# Patient Record
Sex: Female | Born: 1960 | Race: White | Hispanic: No | Marital: Married | Smoking: Never smoker
Health system: Southern US, Community
[De-identification: ages and names within clinical notes are randomized; demographics above are authoritative.]

## PROBLEM LIST (undated history)

## (undated) DIAGNOSIS — R011 Cardiac murmur, unspecified: Secondary | ICD-10-CM

## (undated) DIAGNOSIS — M1990 Unspecified osteoarthritis, unspecified site: Secondary | ICD-10-CM

## (undated) DIAGNOSIS — G893 Neoplasm related pain (acute) (chronic): Secondary | ICD-10-CM

## (undated) DIAGNOSIS — Z9289 Personal history of other medical treatment: Secondary | ICD-10-CM

## (undated) DIAGNOSIS — R51 Headache: Secondary | ICD-10-CM

## (undated) DIAGNOSIS — R519 Headache, unspecified: Secondary | ICD-10-CM

## (undated) DIAGNOSIS — F329 Major depressive disorder, single episode, unspecified: Secondary | ICD-10-CM

## (undated) DIAGNOSIS — F32A Depression, unspecified: Secondary | ICD-10-CM

## (undated) DIAGNOSIS — A6920 Lyme disease, unspecified: Secondary | ICD-10-CM

## (undated) DIAGNOSIS — F410 Panic disorder [episodic paroxysmal anxiety] without agoraphobia: Secondary | ICD-10-CM

## (undated) HISTORY — PX: TUMOR REMOVAL: SHX12

## (undated) HISTORY — DX: Neoplasm related pain (acute) (chronic): G89.3

## (undated) HISTORY — PX: TUBAL LIGATION: SHX77

## (undated) HISTORY — PX: TONSILLECTOMY AND ADENOIDECTOMY: SUR1326

## (undated) HISTORY — DX: Cardiac murmur, unspecified: R01.1

---

## 1999-03-05 ENCOUNTER — Encounter: Payer: Self-pay | Admitting: Obstetrics and Gynecology

## 1999-03-05 ENCOUNTER — Ambulatory Visit (HOSPITAL_COMMUNITY): Admission: RE | Admit: 1999-03-05 | Discharge: 1999-03-05 | Payer: Self-pay | Admitting: Obstetrics and Gynecology

## 1999-04-05 ENCOUNTER — Ambulatory Visit (HOSPITAL_COMMUNITY): Admission: RE | Admit: 1999-04-05 | Discharge: 1999-04-05 | Payer: Self-pay | Admitting: Obstetrics and Gynecology

## 1999-04-05 ENCOUNTER — Encounter: Payer: Self-pay | Admitting: Obstetrics and Gynecology

## 1999-07-02 ENCOUNTER — Ambulatory Visit (HOSPITAL_COMMUNITY): Admission: RE | Admit: 1999-07-02 | Discharge: 1999-07-02 | Payer: Self-pay | Admitting: Obstetrics and Gynecology

## 1999-07-02 ENCOUNTER — Encounter: Payer: Self-pay | Admitting: Obstetrics and Gynecology

## 1999-07-27 ENCOUNTER — Inpatient Hospital Stay (HOSPITAL_COMMUNITY): Admission: AD | Admit: 1999-07-27 | Discharge: 1999-07-30 | Payer: Self-pay | Admitting: Obstetrics and Gynecology

## 1999-10-11 ENCOUNTER — Encounter: Admission: RE | Admit: 1999-10-11 | Discharge: 2000-01-09 | Payer: Self-pay | Admitting: Obstetrics and Gynecology

## 2000-01-12 ENCOUNTER — Encounter: Admission: RE | Admit: 2000-01-12 | Discharge: 2000-04-11 | Payer: Self-pay | Admitting: Obstetrics and Gynecology

## 2000-02-19 ENCOUNTER — Other Ambulatory Visit: Admission: RE | Admit: 2000-02-19 | Discharge: 2000-02-19 | Payer: Self-pay | Admitting: Obstetrics and Gynecology

## 2001-06-29 ENCOUNTER — Other Ambulatory Visit: Admission: RE | Admit: 2001-06-29 | Discharge: 2001-06-29 | Payer: Self-pay | Admitting: Obstetrics and Gynecology

## 2001-07-29 ENCOUNTER — Ambulatory Visit (HOSPITAL_COMMUNITY): Admission: RE | Admit: 2001-07-29 | Discharge: 2001-07-29 | Payer: Self-pay | Admitting: *Deleted

## 2001-07-29 ENCOUNTER — Encounter (INDEPENDENT_AMBULATORY_CARE_PROVIDER_SITE_OTHER): Payer: Self-pay | Admitting: Specialist

## 2002-08-23 ENCOUNTER — Other Ambulatory Visit: Admission: RE | Admit: 2002-08-23 | Discharge: 2002-08-23 | Payer: Self-pay | Admitting: Obstetrics and Gynecology

## 2003-10-12 ENCOUNTER — Other Ambulatory Visit: Admission: RE | Admit: 2003-10-12 | Discharge: 2003-10-12 | Payer: Self-pay | Admitting: Internal Medicine

## 2006-09-12 ENCOUNTER — Other Ambulatory Visit: Admission: RE | Admit: 2006-09-12 | Discharge: 2006-09-12 | Payer: Self-pay | Admitting: Obstetrics and Gynecology

## 2006-10-03 ENCOUNTER — Ambulatory Visit (HOSPITAL_COMMUNITY): Admission: RE | Admit: 2006-10-03 | Discharge: 2006-10-03 | Payer: Self-pay | Admitting: Obstetrics and Gynecology

## 2007-03-16 ENCOUNTER — Inpatient Hospital Stay (HOSPITAL_COMMUNITY): Admission: AD | Admit: 2007-03-16 | Discharge: 2007-03-16 | Payer: Self-pay | Admitting: Obstetrics and Gynecology

## 2007-03-20 ENCOUNTER — Encounter (INDEPENDENT_AMBULATORY_CARE_PROVIDER_SITE_OTHER): Payer: Self-pay | Admitting: Specialist

## 2007-03-20 ENCOUNTER — Inpatient Hospital Stay (HOSPITAL_COMMUNITY): Admission: RE | Admit: 2007-03-20 | Discharge: 2007-03-23 | Payer: Self-pay | Admitting: Obstetrics and Gynecology

## 2011-01-06 ENCOUNTER — Encounter: Payer: Self-pay | Admitting: Obstetrics and Gynecology

## 2011-02-04 ENCOUNTER — Emergency Department (HOSPITAL_COMMUNITY)
Admission: EM | Admit: 2011-02-04 | Discharge: 2011-02-04 | Disposition: A | Discharge status: Home / Self Care | Payer: No Typology Code available for payment source | Attending: Emergency Medicine | Admitting: Emergency Medicine

## 2011-02-04 ENCOUNTER — Emergency Department (HOSPITAL_COMMUNITY): Payer: No Typology Code available for payment source

## 2011-02-04 DIAGNOSIS — R079 Chest pain, unspecified: Secondary | ICD-10-CM | POA: Insufficient documentation

## 2011-02-04 DIAGNOSIS — S20219A Contusion of unspecified front wall of thorax, initial encounter: Secondary | ICD-10-CM | POA: Insufficient documentation

## 2011-05-03 NOTE — H&P (Signed)
NAME:  Lisa Flores, Lisa Flores NO.:  [id number]   MEDICAL RECORD NO.:  [id number]          PATIENT TYPE:  INP   LOCATION:  NA                            FACILITY:  [facility]   PHYSICIAN:  Sherron Monday, MD        DATE OF BIRTH:  09-19-1961   DATE OF ADMISSION:  03/20/2007  DATE OF DISCHARGE:                              HISTORY & PHYSICAL   She is being admitted for a repeat low transverse cesarean section as  well as bilateral tubal ligation.   HPI:  A 50 year old G4, P3-0-0-3, at 63 and 0 weeks for repeat low  transverse cesarean section as well as bilateral tubal ligation.  She  has had good fetal movement, no loss of fluid, no vaginal bleeding, no  contractions but occasional cramping.  She denies any symptoms of  preeclampsia.  Her pregnancy has been complicated by elevated blood  pressures and has had multiple PIH labs which have all come back within  normal limits.  She has been getting bi-weekly NSTs for her elevated  pressures.   PAST MEDICAL HISTORY:  Not significant.   PAST SURGICAL HISTORY:  1. Significant for 3 cesarean sections.  2. Also significant for lipoma removed from her back.   PAST OBGYN HISTORY:  1. G1 was a term cesarean section for failure to progress as well as      fetal stress.  2. G2 was a repeat.  3. G3 was a repeat.  4. G4 is the present pregnancy.  Patient opts for repeat low      transverse cesarean section at this time as well as bilateral tubal      ligation.  5. She has no abnormal Pap smears.  6. No history of any sexually transmitted diseases.   MEDICATIONS:  Prenatal vitamins.   ALLERGIES:  NO KNOWN DRUG ALLERGIES.   SOCIAL HISTORY:  Denies alcohol, tobacco or drug use.  She is married.   FAMILY HISTORY:  Significant for:  1. Hypertension in maternal grandmother.  2. Diabetes in her father.  3. Brain cancer in maternal grandmother.   Is not significant for coronary artery disease.  PNL: hgb 12.2, plts 256K, A +, Ab Scr  neg, UA - gbbs +, gc neg, chl neg,  rpr nr, rubella immune, hepbsag neg, hiv declined,  cf neg, glucola 123,   ultrasound: 1st trimester nl nt, nl afi, welldated  20 weeks good growth, nl anat, ant plac, female   PHYSICAL EXAM:  She is 4 feet 8 inches tall, weighs 229 pounds, blood  pressure was 144/80.  GENERAL:  No apparent distress.  CARDIOVASCULAR:  Regular rate and rhythm.  LUNGS:  Clear to auscultation bilaterally.  ABDOMEN:  Obese, soft and fundus is nontender.  EXTREMITIES:  Symmetric and nontender.  VAGINAL:  Closed, 20 and high, and her cervix was soft.  FETAL HEART TONES:  In the 140's.   ASSESSMENT AND PLAN:  A 50 year old gravida 4, para 3-0-0-3, at 63 and 0  weeks for repeat low transverse cesarean section as well as bilateral  tubal ligation.  I discussed with  the patient risks, benefits and  alternatives of the cesarean section including bleeding, infection,  damage to surrounding organs as well as risk of a hysterectomy and risk  with adhesions.  We also discussed the risks, benefits and alternatives  of bilateral tubal ligation including small risk of failure and  increased chance of an ectopic pregnancy with failure.  Patient voiced  understanding and wished to proceed.  She will go to the hospital  tomorrow morning.  Prior to her cesarean section we will check routine  laboratories as well as pregnancy-induced hypertension laboratories and  blood pressure will be monitored closely.      Sherron Monday, MD  Electronically Signed     JB/MEDQ  D:  03/19/2007  T:  03/19/2007  Job:  [id number]

## 2011-05-03 NOTE — Op Note (Signed)
NAME:  NORISSA, BARTEE NO.:  [id number]   MEDICAL RECORD NO.:  [id number]          PATIENT TYPE:  INP   LOCATION:  NA                            [facility]  [facility]   PHYSICIAN:  Sherron Monday, MD        DATE OF BIRTH:  Jan 03, 1961   DATE OF PROCEDURE:  03/20/2007  DATE OF DISCHARGE:                               OPERATIVE REPORT   PREOPERATIVE DIAGNOSES:  1. Intrauterine pregnancy at term.  2. History of low transverse Cesarean section x3.  3. Undesired fertility.   POSTOPERATIVE DIAGNOSES:  1. Intrauterine pregnancy at term.  2. History of low transverse Cesarean section x3.  3. Undesired fertility.  4. Delivered via repeat low transverse Cesarean section with Bilateral      tubal ligation.   PROCEDURE:  1. Repeat low transverse Cesarean section.  2. Bilateral tubal ligation by [place] method.   SURGEON:  Sherron Monday, M.D.   ASSISTANT:  Huel Cote, M.D.   ANESTHESIA:  Spinal.   COMPLICATIONS:  None.   PATHOLOGY:  Bilateral tubal segments.   ESTIMATED BLOOD LOSS:  400 mL.   INTRAVENOUS FLUIDS:  2500 mL.   URINE OUTPUT:  125 mL of clear urine at the end of the procedure.   FINDINGS:  Viable female infant at 7:48 with Apgars of 8 at one minute and  9 at five minutes and a weight of 7 pounds 12 ounces. Normal uterus,  tubes and ovaries.   DISPOSITION:  Stable to PACU at the end of the procedure.   PROCEDURE:  After informed consent was reviewed with the patient  including the risks, benefits and alternatives of the surgical procedure  as well as failure rate of a tubal ligation of less than 1:200, she was  transported to the OR where spinal was placed and found to be adequate.  She was then prepped and draped in a normal sterile fashion with a  leftward tilt. A Pfannenstiel skin incision was made at the level of the  previous Pfannenstiel incision approximately 2 fingerbreadths above the  pubic symphysis and carried through the underlying layer  of fascia  sharply. The fascia was incised in the midline. The incision was  extended bilaterally with Mayo scissors. Superior aspect of the fascial  incision was grasped with Kocher clamps, elevated and the rectus muscles  were dissected off bluntly and with Bovie cautery. Attention was then  turned to the inferior portion which in a similar fashion was grasped  with Kocher clamps, tented up, and the rectus muscles were dissected off  bluntly and with Bovie cautery. Inadvertently, the peritoneal cavity had  been entered during the dissection of the rectus muscles. The peritoneal  incision was extended with good visualization of the bladder. An Alexis  retractor was placed without complication. The placement was verified.  The uterus was inspected, and bladder flap was created at the  vesicouterine peritoneum. The peritoneum was grasped with smooth  pickups, and using Metzenbaum scissors, bladder flap was created. Using  a scalpel, an uterine incision was made, and the infant delivered  without difficulty from a  vertex presentation. The placenta was then  expressed. The uterus was cleared of all clots and debris. The uterine  incision was closed with 2 layers of 0 Monocryl, the first was a running  locked and the second is an imbricating suture. Attention was then  turned to performing the tubal ligation. The left tube was then  identified out to the fimbriated end. The ovaries were also  investigated. The tube was elevated using a Babcock. A window was made  in the mesosalpinx using Bovie cautery, and using 2 sutures of plain  gut, the tube was ligated and excised. It was found to be hemostatic and  handed off to be sent to pathology. Hemostasis was assured, and  attention was turned to the right tube which in a similar fashion was  identified, followed out to the fimbriated end and elevated Babcock.  Using a Bovie, a window was made in the mesosalpinx. The tube was doubly  ligated using  0 plain gut and the intervening portion was sent to  pathology. Hemostasis was again assured. The pelvis was cleared of all  clot and debris, and the subfascial areas were inspected. The fascia was  closed with 0 Vicryl in a running fashion. The subcuticular adipose  layer was made hemostatic with Bovie cautery. A single running suture of  plain gut was placed to close the dead space, and the skin was closed  with staples. Sponge, lap and needle counts were correct x2 at the end  of the procedure per the operating room staff.      Sherron Monday, MD  Electronically Signed     JB/MEDQ  D:  03/20/2007  T:  03/20/2007  Job:  [id number]

## 2011-05-03 NOTE — Discharge Summary (Signed)
Lisa Flores, Lisa Flores NO.:  [id number]   MEDICAL RECORD NO.:  [id number]          PATIENT TYPE:  INP   LOCATION:  9120                          [facility]ACILITY:  [facility]   PHYSICIAN:  Sherron Monday, MD        DATE OF BIRTH:  07-16-1961   DATE OF ADMISSION:  03/20/2007  DATE OF DISCHARGE:  03/23/2007                               DISCHARGE SUMMARY   ADMISSION DIAGNOSES:  1. Intrauterine pregnancy at term.  2. History of low transverse cesarean section x3.  3. Undesired fertility.   DISCHARGE DIAGNOSES:  1. Intrauterine pregnancy at term.  2. History of low transverse cesarean section x3.  3. Undesired fertility.  4. Status post repeat low transverse cesarean section, bilateral tubal      ligation.   HISTORY OF PRESENT ILLNESS:  50 year old G4, P3-0-0-3 at 28 and 0 weeks  who was admitted on March 20, 2007 for repeat low transverse cesarean  section as well as a bilateral tubal ligation.  She had no complaints at  this time.  Her pregnancy had been complicated by elevated blood  pressures, and she had been evaluated for pregnancy-induced hypertension  on several occasions.   PAST MEDICAL HISTORY:  Negative.   PAST SURGICAL HISTORY:  Significant for three cesarean sections as well  as a lipoma removal from her back.   PAST OB/GYN HISTORY:  G1 was a term cesarean section for failure to  progress in 1997.  G2 was a repeat low transverse cesarean section, 7  pounds 1 ounce.  G3 was a repeat cesarean section in 2004 for at 7 pound  12 ounce baby.  G4 is the present pregnancy.  The patient desires a  repeat as well as a bilateral tubal ligation.  No history of any  abnormal Pap smears.  No history of any sexually transmitted diseases.   MEDICATIONS:  Prenatal vitamins.   ALLERGIES:  NO KNOWN DRUG ALLERGIES.   SOCIAL HISTORY:  Denies alcohol, tobacco, or drug use and is married.   FAMILY HISTORY:  Hypertension in maternal grandmother, diabetes in  father, brain cancer  in maternal grandmother.   PHYSICAL EXAMINATION:  VITAL SIGNS:  On admission, she was afebrile and  vital signs stable with a benign exam.   HOSPITAL COURSE:  The patient was admitted and underwent repeat cesarean  section without complication, delivering a viable female infant at 7:48 on  March 20, 2007, with Apgars of 8 at 1 minute and 9 at 5 minutes and a  weight of 7 pounds 12 ounces.  Normal uterus, tubes, and ovaries.  Estimated blood loss was approximately 400 mL.   Her postpartum course was relatively uncomplicated, with some decreased  urine output after the cesarean section which was relieved with a bolus.  She was discharged to home on postoperative day #3 at her request.  At  this time she had normal lochia, her pain was well-controlled, she was  tolerating a diet, ambulating well, and voiding without difficulty.  She  was discharged to home with routine discharge instructions and numbers  to call with any questions or  problems as well as prescriptions for  Motrin, Vicodin, and prenatal vitamins.   DISCHARGE DATA:  A positive, rubella immune.  Her hemoglobin decreased  from 12.6 to 10.8.  She will using a bilateral tubal ligation for  contraception.  She plans to breast feed.      Sherron Monday, MD  Electronically Signed     JB/MEDQ  D:  03/23/2007  T:  03/23/2007  Job:  [id number]

## 2011-05-03 NOTE — Op Note (Signed)
[facility]  Patient:    Lisa Flores, Lisa Flores                       MRN: [id number] Proc. Date: 07/29/01 Attending:  Vikki Ports, M.D.                           Operative Report  PREOPERATIVE DIAGNOSIS:  Right upper back subcutaneous mass.  POSTOPERATIVE DIAGNOSIS:  Right upper back subcutaneous mass, lipoma.  OPERATION:  Excision of right upper back subcutaneous mass.  ANESTHESIA:  Local MAC.  SURGEON:  Vikki Ports, M.D.  DESCRIPTION OF PROCEDURE:  The patient was taken to the operating room and placed in the supine position.  After adequate MAC anesthesia was induced, the patient was placed in the left lateral decubitus position.  Using lidocaine with bicarbonate, the skin overlying and the surrounding the mass of the upper back was anesthetized.  A vertical incision was made over the mass and dissected down through the subcutaneous fat.  A large lipoma was encountered. It was fairly densely adherent to surrounding fascia and this was incised using Bovie electrocautery and the lipoma was removed en bloc.  Adequate hemostasis was ensured and the skin was closed with staples.  A sterile dressing was applied.  The patient tolerated the procedure well and went to PACU in good condition. DD:  07/29/01 TD:  07/29/01 Job: [id number]

## 2011-06-28 ENCOUNTER — Ambulatory Visit (INDEPENDENT_AMBULATORY_CARE_PROVIDER_SITE_OTHER): Payer: Self-pay | Admitting: General Surgery

## 2011-06-28 ENCOUNTER — Encounter (INDEPENDENT_AMBULATORY_CARE_PROVIDER_SITE_OTHER): Payer: Self-pay | Admitting: General Surgery

## 2011-06-28 VITALS — BP 166/104 | HR 88 | Temp 96.6°F | Ht <= 58 in | Wt 225.8 lb

## 2011-06-28 DIAGNOSIS — D1779 Benign lipomatous neoplasm of other sites: Secondary | ICD-10-CM

## 2011-06-28 DIAGNOSIS — D171 Benign lipomatous neoplasm of skin and subcutaneous tissue of trunk: Secondary | ICD-10-CM

## 2011-06-28 NOTE — Progress Notes (Signed)
Subjective:     Patient ID: Lisa Flores, female   DOB: 1961-05-30, 50 y.o.   MRN: [id number]    BP 166/104  Pulse 88  Temp 96.6 F (35.9 C)  Ht 4\' 8"  (1.422 m)  Wt 225 lb 12.8 oz (102.422 kg)  BMI 50.62 kg/m2    HPI The patient is a 50 year old white female who had a lipoma removed from her upper back about 6 years ago. Shortly afterward the mass recurred. It is steadily been getting larger since then. She has had some tenderness associated with it. She's had no drainage. No fevers or chills. No chest pain or shortness of breath. Review of Systems  Constitutional: Negative.   HENT: Negative.   Eyes: Negative.   Respiratory: Negative.   Cardiovascular: Negative.   Gastrointestinal: Negative.   Genitourinary: Negative.   Musculoskeletal: Negative.   Skin: Negative.   Neurological: Negative.   Hematological: Negative.   Psychiatric/Behavioral: Negative.        Objective:   Physical Exam  Constitutional: She is oriented to person, place, and time. She appears well-developed and well-nourished.       obese  HENT:  Head: Normocephalic and atraumatic.  Eyes: Conjunctivae and EOM are normal. Pupils are equal, round, and reactive to light.  Neck: Normal range of motion. Neck supple.  Cardiovascular: Normal rate, regular rhythm, normal heart sounds and intact distal pulses.   Pulmonary/Chest: Effort normal and breath sounds normal.       Large fatty mass on upper back  Abdominal: Soft. Bowel sounds are normal.  Musculoskeletal: Normal range of motion.  Neurological: She is alert and oriented to person, place, and time. She has normal reflexes.  Skin: Skin is warm and dry.  Psychiatric: She has a normal mood and affect. Her behavior is normal.   Past Medical History  Diagnosis Date  . 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 Cesarean section   . Tonsillectomy and adenoidectomy   . Tumor removal     on back   Current outpatient  prescriptions:etodolac (LODINE) 400 MG tablet, Take 400 mg by mouth 2 (two) times daily.  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 He doesn't has recurred I would like to evaluate with ultrasound to make sure there is nothing unusual about it. If it looks straightforward and I think we could plan to remove it a second time. I have discussed with her in detail the risks and benefits the operation remove this area as well as some of the technical aspects and she understands and wishes to proceed. She understands that it may recur and that she may develop a seroma at the site.    Plan:     Ultrasound and an excision of the area.

## 2011-06-28 NOTE — Patient Instructions (Signed)
Will look at it with ultrasound. If ok then schedule surgical excision.

## 2011-07-01 ENCOUNTER — Ambulatory Visit
Admission: RE | Admit: 2011-07-01 | Discharge: 2011-07-01 | Disposition: A | Discharge status: Home / Self Care | Payer: No Typology Code available for payment source | Source: Ambulatory Visit | Attending: General Surgery | Admitting: General Surgery

## 2011-07-01 DIAGNOSIS — D171 Benign lipomatous neoplasm of skin and subcutaneous tissue of trunk: Secondary | ICD-10-CM

## 2011-07-19 DIAGNOSIS — D1739 Benign lipomatous neoplasm of skin and subcutaneous tissue of other sites: Secondary | ICD-10-CM

## 2011-07-24 ENCOUNTER — Encounter (INDEPENDENT_AMBULATORY_CARE_PROVIDER_SITE_OTHER): Payer: Self-pay | Admitting: General Surgery

## 2011-07-25 ENCOUNTER — Ambulatory Visit (INDEPENDENT_AMBULATORY_CARE_PROVIDER_SITE_OTHER): Payer: Self-pay | Admitting: General Surgery

## 2011-07-25 ENCOUNTER — Encounter (INDEPENDENT_AMBULATORY_CARE_PROVIDER_SITE_OTHER): Payer: Self-pay | Admitting: General Surgery

## 2011-07-25 DIAGNOSIS — D171 Benign lipomatous neoplasm of skin and subcutaneous tissue of trunk: Secondary | ICD-10-CM

## 2011-07-25 DIAGNOSIS — D1779 Benign lipomatous neoplasm of other sites: Secondary | ICD-10-CM

## 2011-07-25 NOTE — Patient Instructions (Signed)
Continue to empty drain twice a day and record output

## 2011-07-29 ENCOUNTER — Encounter (INDEPENDENT_AMBULATORY_CARE_PROVIDER_SITE_OTHER): Payer: Self-pay | Admitting: General Surgery

## 2011-07-29 DIAGNOSIS — D171 Benign lipomatous neoplasm of skin and subcutaneous tissue of trunk: Secondary | ICD-10-CM | POA: Insufficient documentation

## 2011-07-29 NOTE — Progress Notes (Signed)
Subjective:     Patient ID: Lisa Flores, female   DOB: 1961/09/09, 50 y.o.   MRN: [id number]  HPI The patient is a 50 year old white female who is now 6 days out from excision of a large lipoma from her back. We left a drain in place to try to prevent a seroma. She has tolerated this well. She has no complaints today. Her drain is draining about 40-50 cc of serous fluid a day.  Review of Systems     Objective:   Physical Exam On exam the incision on her back is healing nicely. There is no evidence of seroma or infection. The drain is in place.   Assessment:     6 days out from excision of a large lipoma from the back with drain placement.    Plan:     I will plan to leave the drain in until next week at which time we will try to remove it. She agrees to call me if she has any problems in the meantime.

## 2011-08-01 ENCOUNTER — Ambulatory Visit (INDEPENDENT_AMBULATORY_CARE_PROVIDER_SITE_OTHER): Payer: Self-pay | Admitting: General Surgery

## 2011-08-01 ENCOUNTER — Encounter (INDEPENDENT_AMBULATORY_CARE_PROVIDER_SITE_OTHER): Payer: Self-pay | Admitting: General Surgery

## 2011-08-01 DIAGNOSIS — D1779 Benign lipomatous neoplasm of other sites: Secondary | ICD-10-CM

## 2011-08-01 DIAGNOSIS — D171 Benign lipomatous neoplasm of skin and subcutaneous tissue of trunk: Secondary | ICD-10-CM

## 2011-08-01 NOTE — Patient Instructions (Signed)
May shower and return to normal activities

## 2011-08-05 ENCOUNTER — Encounter (INDEPENDENT_AMBULATORY_CARE_PROVIDER_SITE_OTHER): Payer: Self-pay | Admitting: General Surgery

## 2011-08-05 NOTE — Progress Notes (Signed)
Subjective:     Patient ID: Lisa Flores, female   DOB: 1961-03-11, 50 y.o.   MRN: [id number]  HPI 2 weeks postop from removal of a large lipoma from her back. Drain is putting out minimal fluid. No pain. No complaints  Review of Systems     Objective:   Physical Exam Incision is healing well. Drain removed without difficulty    Assessment:     Postop lipoma removal    Plan:     F/U in 2 weeks to check for fluid

## 2011-08-15 ENCOUNTER — Ambulatory Visit (INDEPENDENT_AMBULATORY_CARE_PROVIDER_SITE_OTHER): Payer: Self-pay | Admitting: General Surgery

## 2011-08-15 DIAGNOSIS — D1779 Benign lipomatous neoplasm of other sites: Secondary | ICD-10-CM

## 2011-08-15 DIAGNOSIS — D171 Benign lipomatous neoplasm of skin and subcutaneous tissue of trunk: Secondary | ICD-10-CM

## 2011-08-15 NOTE — Patient Instructions (Signed)
May return to normal activities F/U in 3-4 weeks

## 2011-08-20 NOTE — Progress Notes (Signed)
Subjective:     Patient ID: Lisa Flores, female   DOB: 1961/04/10, 49 y.o.   MRN: [id number]  HPI The patient is a 51 year old white female who is now about 3 weeks out from an excision of a large lipoma from her back. A drain was left in place and the drain was removed at her last visit. She complains only of some minor soreness at the operative site.  Review of Systems     Objective:   Physical Exam On exam her back incision is healing nicely. There is no sign of infection. She has a very small amount of accumulated fluid underneath the skin.    Assessment:     Postop from excision of a large lipoma from the back    Plan:     At this point we will not try to aspirate the fluid. We will follow her up closely to see if the fluid pocket enlarges. She agrees to call if she has any problems. Otherwise we'll see her back in about 2 weeks.

## 2011-09-19 ENCOUNTER — Encounter (INDEPENDENT_AMBULATORY_CARE_PROVIDER_SITE_OTHER): Payer: Self-pay | Admitting: General Surgery

## 2011-09-27 ENCOUNTER — Encounter (INDEPENDENT_AMBULATORY_CARE_PROVIDER_SITE_OTHER): Payer: Self-pay | Admitting: General Surgery

## 2012-02-27 ENCOUNTER — Ambulatory Visit: Payer: Self-pay | Admitting: Family Medicine

## 2012-02-27 VITALS — BP 149/74 | HR 84 | Temp 98.6°F | Resp 16 | Ht <= 58 in | Wt 225.6 lb

## 2012-02-27 DIAGNOSIS — Z131 Encounter for screening for diabetes mellitus: Secondary | ICD-10-CM

## 2012-02-27 DIAGNOSIS — R21 Rash and other nonspecific skin eruption: Secondary | ICD-10-CM

## 2012-02-27 DIAGNOSIS — L259 Unspecified contact dermatitis, unspecified cause: Secondary | ICD-10-CM

## 2012-02-27 DIAGNOSIS — L309 Dermatitis, unspecified: Secondary | ICD-10-CM

## 2012-02-27 MED ORDER — PREDNISONE 20 MG PO TABS: ORAL_TABLET | ORAL | Status: AC

## 2012-02-27 MED ORDER — TRIAMCINOLONE ACETONIDE 0.1 % EX CREA: TOPICAL_CREAM | Freq: Two times a day (BID) | CUTANEOUS | Status: DC

## 2012-02-27 NOTE — Progress Notes (Signed)
  Subjective:    Patient ID: Lisa Flores, female    DOB: [birth year], 51 y.o.   MRN: [id number]  HPI 51 yo female with rash. 1 month.  Dry, itchy, around waist - abdomen and back.  Tried sensitive skin detergents.  No helps.  Worsening.  Feels like hot pins, also itchy.  Now also on arms.  Derm can't get her in until May.  Has been putting on hydrocortisone and aloe vera/lidocaine gel (after sun gel).    Review of Systems Negative except as per HPI     Objective:   Physical Exam  Constitutional: She appears well-developed.  Pulmonary/Chest: Effort normal.  Neurological: She is alert.   Scattered, erythematous plaques with dry, flakes/scale and evidence of excoriation.  Worst is in axillae edges, abdomen.  Also on chest, back, upper arms.    Results for orders placed in visit on 02/27/12  GLUCOSE, POCT (MANUAL RESULT ENTRY)      Component Value Range   POC Glucose 79          Assessment & Plan:  Appears eczematous.  Pred taper and triamcinolone cream

## 2012-03-03 ENCOUNTER — Telehealth: Payer: Self-pay

## 2012-03-03 NOTE — Telephone Encounter (Signed)
LMOM to RTC. 

## 2012-03-03 NOTE — Telephone Encounter (Signed)
Pt states was seen last week regarding a rash and has been taking the medication prescribed, but is not better and wants to know what she should do at this point, please call pt at [phone]

## 2012-03-03 NOTE — Telephone Encounter (Signed)
Please advise patient to RTC. 

## 2012-03-03 NOTE — Telephone Encounter (Signed)
Please advise 

## 2012-03-04 ENCOUNTER — Other Ambulatory Visit: Payer: Self-pay | Admitting: Family Medicine

## 2012-05-26 ENCOUNTER — Other Ambulatory Visit: Payer: Self-pay | Admitting: Physician Assistant

## 2012-05-26 MED ORDER — TRIAMCINOLONE ACETONIDE 0.1 % EX CREA: TOPICAL_CREAM | Freq: Two times a day (BID) | CUTANEOUS | Status: AC

## 2013-03-10 ENCOUNTER — Ambulatory Visit: Payer: Self-pay | Admitting: Family Medicine

## 2013-03-10 ENCOUNTER — Ambulatory Visit: Payer: Self-pay

## 2013-03-10 VITALS — BP 148/76 | HR 93 | Temp 98.4°F | Resp 16 | Ht <= 58 in | Wt 230.8 lb

## 2013-03-10 DIAGNOSIS — R109 Unspecified abdominal pain: Secondary | ICD-10-CM

## 2013-03-10 DIAGNOSIS — N10 Acute tubulo-interstitial nephritis: Secondary | ICD-10-CM

## 2013-03-10 LAB — POCT URINALYSIS DIPSTICK
Bilirubin, UA: NEGATIVE
Ketones, UA: NEGATIVE
Nitrite, UA: NEGATIVE
Spec Grav, UA: 1.01
pH, UA: 7

## 2013-03-10 LAB — POCT CBC
Granulocyte percent: 91.1 %G — AB (ref 37–80)
MCHC: 31.2 g/dL — AB (ref 31.8–35.4)
MID (cbc): 0.4 (ref 0–0.9)
MPV: 7.9 fL (ref 0–99.8)
POC Granulocyte: 14.2 — AB (ref 2–6.9)
POC LYMPH PERCENT: 6.6 %L — AB (ref 10–50)
POC MID %: 2.3 %M (ref 0–12)
Platelet Count, POC: 340 10*3/uL (ref 142–424)
RDW, POC: 14.1 %

## 2013-03-10 LAB — POCT UA - MICROSCOPIC ONLY
Mucus, UA: NEGATIVE
Yeast, UA: NEGATIVE

## 2013-03-10 LAB — POCT URINE PREGNANCY: Preg Test, Ur: NEGATIVE

## 2013-03-10 MED ORDER — CIPROFLOXACIN HCL 500 MG PO TABS: 500.0000 mg | ORAL_TABLET | Freq: Two times a day (BID) | ORAL | Status: DC

## 2013-03-10 MED ORDER — HYDROCODONE-ACETAMINOPHEN 5-325 MG PO TABS: 1.0000 | ORAL_TABLET | Freq: Four times a day (QID) | ORAL | Status: DC | PRN

## 2013-03-10 MED ORDER — CEFTRIAXONE SODIUM 1 G IJ SOLR
1.0000 g | Freq: Once | INTRAMUSCULAR | Status: AC
  Administered 2013-03-10: 1 g via INTRAMUSCULAR

## 2013-03-10 MED ORDER — KETOROLAC TROMETHAMINE 60 MG/2ML IM SOLN
60.0000 mg | Freq: Once | INTRAMUSCULAR | Status: AC
  Administered 2013-03-10: 60 mg via INTRAMUSCULAR

## 2013-03-10 NOTE — Patient Instructions (Addendum)
We have given you an injection of antibiotics here in the office.  Begin the Cipro today, be sure to take the full course.  We have also given you an injection of pain medication.  You may use the Norco every 6 hours if needed for pain relief.  Continue hydrating well with water.  Come back in in 48 hours so we can recheck you and make sure you are responding to the antibiotics - come in sooner if you feel like you are worsening   Pyelonephritis, Adult Pyelonephritis is a kidney infection. In general, there are 2 main types of pyelonephritis:  Infections that come on quickly without any warning (acute pyelonephritis).  Infections that persist for a long period of time (chronic pyelonephritis). CAUSES  Two main causes of pyelonephritis are:  Bacteria traveling from the bladder to the kidney. This is a problem especially in pregnant women. The urine in the bladder can become filled with bacteria from multiple causes, including:  Inflammation of the prostate gland (prostatitis).  Sexual intercourse in females.  Bladder infection (cystitis).  Bacteria traveling from the bloodstream to the tissue part of the kidney. Problems that may increase your risk of getting a kidney infection include:  Diabetes.  Kidney stones or bladder stones.  Cancer.  Catheters placed in the bladder.  Other abnormalities of the kidney or ureter. SYMPTOMS   Abdominal pain.  Pain in the side or flank area.  Fever.  Chills.  Upset stomach.  Blood in the urine (dark urine).  Frequent urination.  Strong or persistent urge to urinate.  Burning or stinging when urinating. DIAGNOSIS  Your caregiver may diagnose your kidney infection based on your symptoms. A urine sample may also be taken. TREATMENT  In general, treatment depends on how severe the infection is.   If the infection is mild and caught early, your caregiver may treat you with oral antibiotics and send you home.  If the infection is  more severe, the bacteria may have gotten into the bloodstream. This will require intravenous (IV) antibiotics and a hospital stay. Symptoms may include:  High fever.  Severe flank pain.  Shaking chills.  Even after a hospital stay, your caregiver may require you to be on oral antibiotics for a period of time.  Other treatments may be required depending upon the cause of the infection. HOME CARE INSTRUCTIONS   Take your antibiotics as directed. Finish them even if you start to feel better.  Make an appointment to have your urine checked to make sure the infection is gone.  Drink enough fluids to keep your urine clear or pale yellow.  Take medicines for the bladder if you have urgency and frequency of urination as directed by your caregiver. SEEK IMMEDIATE MEDICAL CARE IF:   You have a fever or persistent symptoms for more than 2-3 days.  You have a fever and your symptoms suddenly get worse.  You are unable to take your antibiotics or fluids.  You develop shaking chills.  You experience extreme weakness or fainting.  There is no improvement after 2 days of treatment. MAKE SURE YOU:  Understand these instructions.  Will watch your condition.  Will get help right away if you are not doing well or get worse. Document Released: 12/02/2005 Document Revised: 06/02/2012 Document Reviewed: 05/08/2011 [facility] Patient Information 2013 [place], [place].

## 2013-03-10 NOTE — Progress Notes (Signed)
Subjective:    Patient ID: Lisa Flores, female    DOB: 1961-04-08, 52 y.o.   MRN: [id number]  HPI   [name] is a pleasant 52 yr old female here with concern for illness.  Has been having significant right sided back pain since about 7:30am this morning.  Throwing up from the pain - thinks she vomited about 4 times.  Feeling less nauseous now.  Describes the pain as sharp and burning, no radiation.  Was 10/10 this morning.  Now more like 7-8/10.  The pain waxes and wanes.  Nothing seems to make pain better or worse.  Has not taken anything, wasn't sure she could keep anything down this morning.  No history of stones.  She has never had anything like this before.  Pain is all right sided.  No dysuria, urgency, frequency, or hematuria.  No known injury to the back.  Denies abdominal pain.  Denies diarrhea or constipation.  Last BM yesterday, normal.  Usually goes every day.  Appetite down today.  Endorses subjective fever and chills but no documented fevers.     Review of Systems  Constitutional: Positive for fever (subjective), chills (subjective) and appetite change (decreased).  HENT: Negative.   Respiratory: Negative.   Cardiovascular: Negative.   Gastrointestinal: Positive for nausea and vomiting. Negative for abdominal pain and constipation.  Genitourinary: Positive for flank pain (right). Negative for dysuria, urgency, frequency and hematuria.  Musculoskeletal: Positive for back pain.  Skin: Negative.   Neurological: Negative.        Objective:   Physical Exam  Vitals reviewed. Constitutional: She is oriented to person, place, and time. She appears well-developed and well-nourished. No distress.  HENT:  Head: Normocephalic and atraumatic.  Eyes: Conjunctivae are normal. No scleral icterus.  Cardiovascular: Normal rate and regular rhythm.   Pulmonary/Chest: Effort normal and breath sounds normal. She has no wheezes. She has no rales.  Abdominal: Soft. Bowel sounds are normal.  She exhibits no distension and no mass. There is no tenderness. There is CVA tenderness (right). There is no rebound and no guarding.  Musculoskeletal:       Thoracic back: She exhibits no tenderness and no bony tenderness.       Lumbar back: She exhibits no tenderness and no bony tenderness.  Neurological: She is alert and oriented to person, place, and time.  Skin: Skin is warm and dry.  Psychiatric: She has a normal mood and affect. Her behavior is normal.     Filed Vitals:   03/10/13 1347  BP: 148/76  Pulse: 93  Temp: 98.4 F (36.9 C)  Resp: 16     Results for orders placed in visit on 03/10/13  POCT UA - MICROSCOPIC ONLY      Result Value Range   WBC, Ur, HPF, POC 2-5     RBC, urine, microscopic 0-1     Bacteria, U Microscopic trace     Mucus, UA neg     Epithelial cells, urine per micros 3-6     Crystals, Ur, HPF, POC neg     Casts, Ur, LPF, POC neg     Yeast, UA neg    POCT URINALYSIS DIPSTICK      Result Value Range   Color, UA yellow     Clarity, UA clear     Glucose, UA neg     Bilirubin, UA neg     Ketones, UA neg     Spec Grav, UA 1.010  Blood, UA small     pH, UA 7.0     Protein, UA neg     Urobilinogen, UA 0.2     Nitrite, UA neg     Leukocytes, UA small (1+)    POCT CBC      Result Value Range   WBC 15.6 (*) 4.6 - 10.2 K/uL   Lymph, poc 1.0  0.6 - 3.4   POC LYMPH PERCENT 6.6 (*) 10 - 50 %L   MID (cbc) 0.4  0 - 0.9   POC MID % 2.3  0 - 12 %M   POC Granulocyte 14.2 (*) 2 - 6.9   Granulocyte percent 91.1 (*) 37 - 80 %G   RBC 4.51  4.04 - 5.48 M/uL   Hemoglobin 12.3  12.2 - 16.2 g/dL   HCT, POC 09.8  11.9 - 47.9 %   MCV 87.3  80 - 97 fL   MCH, POC 27.3  27 - 31.2 pg   MCHC 31.2 (*) 31.8 - 35.4 g/dL   RDW, POC 14.7     Platelet Count, POC 340  142 - 424 K/uL   MPV 7.9  0 - 99.8 fL  POCT URINE PREGNANCY      Result Value Range   Preg Test, Ur Negative       [facility] reading (PRIMARY) by  Dr. Neva Seat - no definite nephrolith; questionable  soft tissue shadow due to pannus; no acute bowel findings      Assessment & Plan:  Acute pyelonephritis - Plan: cefTRIAXone (ROCEPHIN) injection 1 g, ciprofloxacin (CIPRO) 500 MG tablet  Right flank pain - Plan: POCT UA - Microscopic Only, POCT urinalysis dipstick, POCT CBC, POCT urine pregnancy, Urine culture, DG Abd 2 Views, ketorolac (TORADOL) injection 60 mg, HYDROcodone-acetaminophen (NORCO) 5-325 MG per tablet  [name] is a pleasant 52 yr old female here with sudden onset of right flank pain today.  UA with small blood, small leuks; denies urinary symptoms.  WBC count is elevated at 15.6.  No acute findings on abdominal plain film.  Will treat as acute pyelonephritis.  Ceftriaxone and toradol given in clinic.  Will start Cipro BID x 14 days.  Push fluids.  Norco q6h for pain if needed.  Will have pt follow up in 48 hours to ensure she is responding to abx, sooner if worsening.

## 2013-03-10 NOTE — Progress Notes (Signed)
Xray read and patient discussed with Ms. Debbra Riding. Agree with assessment and plan of care per her note. Radiology overread noted: IMPRESSION:  Nonspecific nonobstructive bowel gas pattern. No pathologic  calcifications are identified. Mild lumbar levoscoliosis.

## 2013-03-12 LAB — URINE CULTURE: Colony Count: 45000

## 2013-03-12 MED ORDER — AMOXICILLIN 875 MG PO TABS: 875.0000 mg | ORAL_TABLET | Freq: Two times a day (BID) | ORAL | Status: DC

## 2013-03-12 NOTE — Progress Notes (Signed)
Addendum: Urine cx returned growing group b strep.  Will change from cipro to amoxicillin.  Pt should be returning sometime today for recheck

## 2013-03-12 NOTE — Addendum Note (Signed)
Addended by: Godfrey Pick on: 03/12/2013 09:08 AM   Modules accepted: Orders

## 2014-06-09 ENCOUNTER — Ambulatory Visit (INDEPENDENT_AMBULATORY_CARE_PROVIDER_SITE_OTHER): Payer: BC Managed Care – PPO | Admitting: Family Medicine

## 2014-06-09 VITALS — BP 134/82 | HR 89 | Temp 98.0°F | Resp 18 | Ht <= 58 in | Wt 227.0 lb

## 2014-06-09 DIAGNOSIS — R6883 Chills (without fever): Secondary | ICD-10-CM

## 2014-06-09 DIAGNOSIS — M545 Low back pain, unspecified: Secondary | ICD-10-CM

## 2014-06-09 DIAGNOSIS — R509 Fever, unspecified: Secondary | ICD-10-CM

## 2014-06-09 DIAGNOSIS — Z20818 Contact with and (suspected) exposure to other bacterial communicable diseases: Secondary | ICD-10-CM

## 2014-06-09 DIAGNOSIS — Z2089 Contact with and (suspected) exposure to other communicable diseases: Secondary | ICD-10-CM

## 2014-06-09 DIAGNOSIS — N3000 Acute cystitis without hematuria: Secondary | ICD-10-CM

## 2014-06-09 LAB — POCT RAPID STREP A (OFFICE): Rapid Strep A Screen: NEGATIVE

## 2014-06-09 LAB — CBC WITH DIFFERENTIAL/PLATELET
BASOS ABS: 0.1 10*3/uL (ref 0.0–0.1)
Basophils Relative: 2 % — ABNORMAL HIGH (ref 0–1)
EOS PCT: 0 % (ref 0–5)
Eosinophils Absolute: 0 10*3/uL (ref 0.0–0.7)
HEMATOCRIT: 40.1 % (ref 36.0–46.0)
Hemoglobin: 13.5 g/dL (ref 12.0–15.0)
LYMPHS PCT: 18 % (ref 12–46)
Lymphs Abs: 0.6 10*3/uL — ABNORMAL LOW (ref 0.7–4.0)
MCH: 28 pg (ref 26.0–34.0)
MCHC: 33.7 g/dL (ref 30.0–36.0)
MCV: 83.2 fL (ref 78.0–100.0)
MONO ABS: 0.3 10*3/uL (ref 0.1–1.0)
MONOS PCT: 9 % (ref 3–12)
Neutro Abs: 2.6 10*3/uL (ref 1.7–7.7)
Neutrophils Relative %: 71 % (ref 43–77)
Platelets: 90 10*3/uL — ABNORMAL LOW (ref 150–400)
RBC: 4.82 MIL/uL (ref 3.87–5.11)
RDW: 14.3 % (ref 11.5–15.5)
WBC: 3.6 10*3/uL — AB (ref 4.0–10.5)

## 2014-06-09 LAB — POCT UA - MICROSCOPIC ONLY
CASTS, UR, LPF, POC: NEGATIVE
CRYSTALS, UR, HPF, POC: NEGATIVE
Mucus, UA: POSITIVE
Yeast, UA: NEGATIVE

## 2014-06-09 LAB — POCT URINALYSIS DIPSTICK
Glucose, UA: NEGATIVE
Ketones, UA: NEGATIVE
NITRITE UA: NEGATIVE
PH UA: 5
PROTEIN UA: 100
Spec Grav, UA: 1.015
UROBILINOGEN UA: 1

## 2014-06-09 MED ORDER — SULFAMETHOXAZOLE-TMP DS 800-160 MG PO TABS: 1.0000 | ORAL_TABLET | Freq: Two times a day (BID) | ORAL | Status: DC

## 2014-06-09 NOTE — Patient Instructions (Addendum)
Drink plenty of fluids  Tylenol or ibuprofen for fevers  Take Bactrim one twice daily for 3 days while cultures pending.

## 2014-06-09 NOTE — Progress Notes (Signed)
Subjective:   Patient has not been feeling well. She had a temperature of 101 at home. She just has generalized malaise without any specific symptoms. She didn't chills. Tylenol and ibuprofen for coming in here. This started a day or 2 ago. Her son was sick with strep Sunday.  Objective: TMs are normal. Throat clear. Neck supple without nodes. Chest clear. Heart regular without murmurs. And soft the mass or tenderness. No CVA tenderness. She is on menstrual cycle.  Assessment: Minimal pyuria with 5-7 WBCs Probable viremia  Plan: She felt like this in the past with UTIs so I put her on 3 days of antibiotics pending culture.  Results for orders placed in visit on 06/09/14  POCT URINALYSIS DIPSTICK      Result Value Ref Range   Color, UA amber     Clarity, UA cloudy     Glucose, UA neg     Bilirubin, UA small     Ketones, UA neg     Spec Grav, UA 1.015     Blood, UA large     pH, UA 5.0     Protein, UA 100     Urobilinogen, UA 1.0     Nitrite, UA neg     Leukocytes, UA small (1+)    POCT UA - MICROSCOPIC ONLY      Result Value Ref Range   WBC, Ur, HPF, POC 5-7     RBC, urine, microscopic TNTC     Bacteria, U Microscopic 1+     Mucus, UA positive     Epithelial cells, urine per micros 5-10     Crystals, Ur, HPF, POC neg     Casts, Ur, LPF, POC neg     Yeast, UA neg    POCT RAPID STREP A (OFFICE)      Result Value Ref Range   Rapid Strep A Screen Negative  Negative

## 2014-06-10 LAB — URINE CULTURE

## 2014-06-13 ENCOUNTER — Ambulatory Visit (INDEPENDENT_AMBULATORY_CARE_PROVIDER_SITE_OTHER): Payer: BC Managed Care – PPO | Admitting: Internal Medicine

## 2014-06-13 ENCOUNTER — Ambulatory Visit: Payer: BC Managed Care – PPO

## 2014-06-13 ENCOUNTER — Other Ambulatory Visit: Payer: Self-pay | Admitting: Internal Medicine

## 2014-06-13 VITALS — BP 152/69 | HR 108 | Temp 100.0°F | Resp 18 | Ht <= 58 in | Wt 226.8 lb

## 2014-06-13 DIAGNOSIS — R799 Abnormal finding of blood chemistry, unspecified: Secondary | ICD-10-CM

## 2014-06-13 DIAGNOSIS — R7989 Other specified abnormal findings of blood chemistry: Secondary | ICD-10-CM

## 2014-06-13 DIAGNOSIS — R509 Fever, unspecified: Secondary | ICD-10-CM

## 2014-06-13 DIAGNOSIS — D7289 Other specified disorders of white blood cells: Secondary | ICD-10-CM

## 2014-06-13 DIAGNOSIS — D72819 Decreased white blood cell count, unspecified: Secondary | ICD-10-CM

## 2014-06-13 DIAGNOSIS — M545 Low back pain, unspecified: Secondary | ICD-10-CM

## 2014-06-13 DIAGNOSIS — R319 Hematuria, unspecified: Secondary | ICD-10-CM

## 2014-06-13 DIAGNOSIS — R945 Abnormal results of liver function studies: Secondary | ICD-10-CM

## 2014-06-13 DIAGNOSIS — Z6841 Body Mass Index (BMI) 40.0 and over, adult: Secondary | ICD-10-CM

## 2014-06-13 DIAGNOSIS — R888 Abnormal findings in other body fluids and substances: Secondary | ICD-10-CM

## 2014-06-13 DIAGNOSIS — K141 Geographic tongue: Secondary | ICD-10-CM

## 2014-06-13 DIAGNOSIS — D696 Thrombocytopenia, unspecified: Secondary | ICD-10-CM

## 2014-06-13 DIAGNOSIS — R1011 Right upper quadrant pain: Secondary | ICD-10-CM

## 2014-06-13 LAB — POCT UA - MICROSCOPIC ONLY
AMORPHOUS: POSITIVE
CRYSTALS, UR, HPF, POC: NEGATIVE
Casts, Ur, LPF, POC: NEGATIVE
Mucus, UA: POSITIVE
Yeast, UA: NEGATIVE

## 2014-06-13 LAB — CBC
HCT: 36.2 % (ref 36.0–46.0)
Hemoglobin: 12.6 g/dL (ref 12.0–15.0)
MCH: 27.9 pg (ref 26.0–34.0)
MCHC: 34.8 g/dL (ref 30.0–36.0)
MCV: 80.1 fL (ref 78.0–100.0)
Platelets: 63 10*3/uL — ABNORMAL LOW (ref 150–400)
RBC: 4.52 MIL/uL (ref 3.87–5.11)
RDW: 14.6 % (ref 11.5–15.5)
WBC: 8.7 10*3/uL (ref 4.0–10.5)

## 2014-06-13 LAB — COMPREHENSIVE METABOLIC PANEL
ALT: 44 U/L — AB (ref 0–35)
AST: 42 U/L — ABNORMAL HIGH (ref 0–37)
Albumin: 3.5 g/dL (ref 3.5–5.2)
Alkaline Phosphatase: 99 U/L (ref 39–117)
BILIRUBIN TOTAL: 0.8 mg/dL (ref 0.2–1.2)
BUN: 16 mg/dL (ref 6–23)
CALCIUM: 8.1 mg/dL — AB (ref 8.4–10.5)
CHLORIDE: 95 meq/L — AB (ref 96–112)
CO2: 27 meq/L (ref 19–32)
CREATININE: 0.77 mg/dL (ref 0.50–1.10)
GLUCOSE: 104 mg/dL — AB (ref 70–99)
Potassium: 3.3 mEq/L — ABNORMAL LOW (ref 3.5–5.3)
Sodium: 133 mEq/L — ABNORMAL LOW (ref 135–145)
Total Protein: 5.9 g/dL — ABNORMAL LOW (ref 6.0–8.3)

## 2014-06-13 LAB — POCT URINALYSIS DIPSTICK
Glucose, UA: NEGATIVE
LEUKOCYTES UA: NEGATIVE
NITRITE UA: NEGATIVE
PH UA: 5.5
Protein, UA: >=300
Spec Grav, UA: 1.02
Urobilinogen, UA: 1

## 2014-06-13 LAB — POCT SKIN KOH: SKIN KOH, POC: NEGATIVE

## 2014-06-13 LAB — POCT SEDIMENTATION RATE: POCT SED RATE: 32 mm/h — AB (ref 0–22)

## 2014-06-13 NOTE — Progress Notes (Addendum)
Subjective:  This chart was scribed for [name], MD by Randa Evens, ED Scribe. This Patient was seen in room 10 and the patients care was started at 4:34 PM   Patient ID: Lisa Flores, female    DOB: [birth year], 53 y.o.   MRN: [id number]  HPI Lisa Flores is a 53 y.o. female Present to urgent medical for follow up stating that her fever and chills have not improved onset 1 week prior. She states the fever and chills are a continous cycle. She states she feels associated congestion, ear pain, back pain that worsens with deep breathing, and headache. She states is on meloxicam for knee arthritis, but hasn't taken it the past week due to the fever she has been having. She denies sore throat, cough,  abdominal pain, diarrhea, dysuria, frequency or  shortness of breath. She states she was 3 weeks late for her last menstrual cycle with light bleeding just ending today. Last menses before that was 2 mos late. She often has dysmenorrhra. Fever started before spotting. S/P BTL after last C-section.   She denies any past major medical conditions that could contribute to her symptoms.  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  HENT: Positive for congestion and ear pain. Negative for sore throat.   Respiratory: Negative for cough and shortness of breath.   Gastrointestinal: Negative for abdominal pain and diarrhea.  Genitourinary: Negative for dysuria and frequency.  Musculoskeletal: Positive for back pain.  Neurological: Positive for headaches.     Objective:  BP 152/69   Pulse 108   Temp(Src) 100 F (37.8 C) (Oral)   Resp 18   Ht 4\' 10"  (1.473 m)   Wt 226 lb 12.8 oz (102.876 kg)   BMI 47.41 kg/m2   SpO2 96%   LMP 06/07/2014   Physical Exam  Nursing note and vitals reviewed. Constitutional: She is oriented to person, place, and time. She appears well-developed and  well-nourished. No distress.  obese  HENT:  Head: Normocephalic and atraumatic.  Right Ear: External ear normal.  Left Ear: External ear normal.  Nose: Nose normal.  Oropharynx has a tongue with several 1cm circular lesions that are non tender, tongue coated white  Eyes: Conjunctivae and EOM are normal.  Neck: Neck supple. No thyromegaly present.  Cardiovascular: Normal rate, regular rhythm, normal heart sounds and intact distal pulses.   No murmur heard. Pulmonary/Chest: Effort normal and breath sounds normal. No respiratory distress.  Abdominal: She exhibits no distension and no mass. There is tenderness in the right upper quadrant. There is guarding. There is no rebound.  Tender to RUQ with palpation, tender to liver edge.  Musculoskeletal: She exhibits no edema.  SLR creates tenderness in lumbar region bilaterally, no joint tenderness or redness.   Lymphadenopathy:    She has no cervical adenopathy.  Neurological: She is alert and oriented to person, place, and time. No cranial nerve deficit. Coordination normal.  Skin: Skin is warm and dry. No rash noted.  Psychiatric: She has a normal mood and affect. Her behavior is normal.    [facility] reading (PRIMARY) by  Dr. Laney Pastor spine exhibits an abnormal curvature which is probably positional as it corrected somewhat with a change on table///there are no bony lesions or disc space abnormalities to suggest infection or Chest is clear though there is poor inspiration on the lateral//there  is a question of right hilar adenopathy or enlargement  Results for orders placed in visit on 06/13/14  POCT SKIN KOH      Result Value Ref Range   Skin KOH, POC Negative    POCT UA - MICROSCOPIC ONLY      Result Value Ref Range   WBC, Ur, HPF, POC 1-3     RBC, urine, microscopic 0-2     Bacteria, U Microscopic 3+     Mucus, UA pos     Epithelial cells, urine per micros 0-2     Crystals, Ur, HPF, POC neg     Casts, Ur, LPF, POC neg     Yeast, UA  neg     Amorphous pos    POCT URINALYSIS DIPSTICK      Result Value Ref Range   Color, UA orange     Clarity, UA cloudy     Glucose, UA neg     Bilirubin, UA small     Ketones, UA trace     Spec Grav, UA 1.020     Blood, UA moderate     pH, UA 5.5     Protein, UA >=300     Urobilinogen, UA 1.0     Nitrite, UA neg     Leukocytes, UA Negative         Assessment & Plan:  Fever, unspecified fever cause - Plan: CBC, Comprehensive metabolic panel, POCT SEDIMENTATION RATE,   Low back pain without sciatica,    Leukopenia---recheck  Thrombocytopenia, unspecified---recheck  Hematuria - resolved--likely due to menses  Geographic tongue - Plan: POCT Skin KOH==neg  Abdominal pain, right upper quadrant--CMET pending   For now she will use antipyretics to try to stay comfortable and we will call with  Results/plan    I have completed the patient encounter in its entirety as documented by the scribe, with editing by me where necessary. Robert P. Laney Pastor, M.D.  Results for orders placed in visit on 06/13/14  CBC      Result Value Ref Range   WBC 8.7  4.0 - 10.5 K/uL   RBC 4.52  3.87 - 5.11 MIL/uL   Hemoglobin 12.6  12.0 - 15.0 g/dL   HCT 36.2  36.0 - 46.0 %   MCV 80.1  78.0 - 100.0 fL   MCH 27.9  26.0 - 34.0 pg   MCHC 34.8  30.0 - 36.0 g/dL   RDW 14.6  11.5 - 15.5 %   Platelets 63 (*) 150 - 400 K/uL  COMPREHENSIVE METABOLIC PANEL      Result Value Ref Range   Sodium 133 (*) 135 - 145 mEq/L   Potassium 3.3 (*) 3.5 - 5.3 mEq/L   Chloride 95 (*) 96 - 112 mEq/L   CO2 27  19 - 32 mEq/L   Glucose, Bld 104 (*) 70 - 99 mg/dL   BUN 16  6 - 23 mg/dL   Creat 0.77  0.50 - 1.10 mg/dL   Total Bilirubin 0.8  0.2 - 1.2 mg/dL   Alkaline Phosphatase 99  39 - 117 U/L   AST 42 (*) 0 - 37 U/L   ALT 44 (*) 0 - 35 U/L   Total Protein 5.9 (*) 6.0 - 8.3 g/dL   Albumin 3.5  3.5 - 5.2 g/dL   Calcium 8.1 (*) 8.4 - 10.5 mg/dL  POCT SEDIMENTATION RATE      Result Value Ref Range   POCT  SED RATE 32 (*) 0 - 22  mm/hr  POCT SKIN KOH      Result Value Ref Range   Skin KOH, POC---tongue Negative     lymphs atypical while platelets reported as unremarkable Abn lfts/low Ca,Na,K Will add EBVIgM,CMVIgm,Hep C Ab

## 2014-06-14 LAB — DIFFERENTIAL
BASOS ABS: 0 10*3/uL (ref 0.0–0.1)
Basophils Relative: 0 % (ref 0–1)
EOS PCT: 0 % (ref 0–5)
Eosinophils Absolute: 0 10*3/uL (ref 0.0–0.7)
Lymphocytes Relative: 11 % — ABNORMAL LOW (ref 12–46)
Lymphs Abs: 1 10*3/uL (ref 0.7–4.0)
Monocytes Absolute: 0.6 10*3/uL (ref 0.1–1.0)
Monocytes Relative: 6 % (ref 3–12)
NEUTROS PCT: 82 % — AB (ref 43–77)
Neutro Abs: 7.7 10*3/uL (ref 1.7–7.7)

## 2014-06-14 NOTE — Addendum Note (Signed)
Addended by: Leandrew Koyanagi on: 06/14/2014 04:16 PM   Modules accepted: Orders

## 2014-06-15 LAB — EPSTEIN-BARR VIRUS VCA ANTIBODY PANEL
EBV EA IGG: 148 U/mL — AB (ref <9.0)
EBV NA IgG: 75.2 U/mL — ABNORMAL HIGH (ref <18.0)
EBV VCA IGM: 14.4 U/mL (ref <36.0)
EBV VCA IgG: >750 U/mL — ABNORMAL HIGH (ref <18.0)

## 2014-06-15 LAB — HEPATITIS C ANTIBODY: HCV AB: NEGATIVE

## 2014-06-16 ENCOUNTER — Telehealth: Payer: Self-pay | Admitting: *Deleted

## 2014-06-16 LAB — CYTOMEGALOVIRUS ANTIBODY, IGG

## 2014-06-16 NOTE — Telephone Encounter (Signed)
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

## 2014-06-16 NOTE — Telephone Encounter (Signed)
lmom for pt to cb

## 2014-06-16 NOTE — Telephone Encounter (Signed)
Dr Laney Pastor wants pt to RTC for a recheck. He is worried she may have RMSF. Luellen Pucker and I have left multiple messages for this pt today. Per Dr. Laney Pastor, keep trying.

## 2014-06-17 ENCOUNTER — Encounter: Payer: Self-pay | Admitting: Internal Medicine

## 2014-06-23 ENCOUNTER — Other Ambulatory Visit: Payer: Self-pay | Admitting: Physician Assistant

## 2014-07-20 ENCOUNTER — Ambulatory Visit (INDEPENDENT_AMBULATORY_CARE_PROVIDER_SITE_OTHER): Payer: BC Managed Care – PPO | Admitting: Internal Medicine

## 2014-07-20 ENCOUNTER — Encounter: Payer: Self-pay | Admitting: Internal Medicine

## 2014-07-20 VITALS — BP 163/89 | HR 86 | Temp 98.5°F | Ht <= 58 in | Wt 217.0 lb

## 2014-07-20 DIAGNOSIS — A6920 Lyme disease, unspecified: Secondary | ICD-10-CM | POA: Diagnosis not present

## 2014-07-20 LAB — CBC WITH DIFFERENTIAL/PLATELET
BASOS PCT: 0 % (ref 0–1)
Basophils Absolute: 0 10*3/uL (ref 0.0–0.1)
Eosinophils Absolute: 0.1 10*3/uL (ref 0.0–0.7)
Eosinophils Relative: 1 % (ref 0–5)
HCT: 37.9 % (ref 36.0–46.0)
Hemoglobin: 12.7 g/dL (ref 12.0–15.0)
Lymphocytes Relative: 30 % (ref 12–46)
Lymphs Abs: 2.2 10*3/uL (ref 0.7–4.0)
MCH: 28.2 pg (ref 26.0–34.0)
MCHC: 33.5 g/dL (ref 30.0–36.0)
MCV: 84.2 fL (ref 78.0–100.0)
Monocytes Absolute: 0.5 10*3/uL (ref 0.1–1.0)
Monocytes Relative: 7 % (ref 3–12)
NEUTROS PCT: 62 % (ref 43–77)
Neutro Abs: 4.5 10*3/uL (ref 1.7–7.7)
PLATELETS: 282 10*3/uL (ref 150–400)
RBC: 4.5 MIL/uL (ref 3.87–5.11)
RDW: 16.2 % — ABNORMAL HIGH (ref 11.5–15.5)
WBC: 7.3 10*3/uL (ref 4.0–10.5)

## 2014-07-20 NOTE — Progress Notes (Signed)
Subjective:    Patient ID: Lisa Flores, female    DOB: 1961/04/28, 53 y.o.   MRN: [id number]  HPI 53 yo F with no significant past medical history reports having new onset syndrome of high fevers of 102-104, chills, myalgias and flu like symptoms without respiratory complaints. She was seen at an urgent care center and initially placed on bactrim for possible urinary tract infection. She recalls that her pcp mentioned her labs having low wbc and low platelet She progressed to feeling poorly with addition of still being incapacitated plus having headache and arthralgias, she respresented for further evaluation and was started on doxycycline by her pcp. She recalls feeling improved. She did receive call from urgent care center who then mentioned her labs were positive for lyme disease. She finished a 20 day course of doxycycline and has improved considerably but not completely back to her baseline, still feels fatigue. She recalls that she spent much time outdoors, and the weekend before Father's day, she sustained numerous insect bites, unclear if they were tick bites. She has not traveled to the [place]. Resides in [place], [place].   Labs: July 1st show wbc 8.7, plt 71 Lyme western blot shows IgM +p23, p41 & IgG +p23, p39, p41, p45, p58, p66  No Known Allergies Active Ambulatory Problems    Diagnosis Date Noted  . 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 BMI 45.0-49.9, adult 06/13/2014   Resolved Ambulatory Problems    Diagnosis Date Noted  . No Resolved Ambulatory Problems   Past Medical History  Diagnosis Date  . Tumor associated pain   . Heart murmur    History  Substance Use Topics  . Smoking status: Never Smoker   . Smokeless tobacco: Not on file  . Alcohol Use: No  family history includes Cancer in her maternal grandmother.   Review of Systems  Constitutional: +fatigue Negative for fever, chills, diaphoresis, activity change, appetite change, fatigue and unexpected weight change.    HENT: Negative for congestion, sore throat, rhinorrhea, sneezing, trouble swallowing and sinus pressure.  Eyes: Negative for photophobia and visual disturbance.  Respiratory: Negative for cough, chest tightness, shortness of breath, wheezing and stridor.  Cardiovascular: Negative for chest pain, palpitations and leg swelling.  Gastrointestinal: Negative for nausea, vomiting, abdominal pain, diarrhea, constipation, blood in stool, abdominal distention and anal bleeding.  Genitourinary: Negative for dysuria, hematuria, flank pain and difficulty urinating.  Musculoskeletal: Negative for myalgias, back pain, joint swelling, arthralgias and gait problem.  Skin: Negative for color change, pallor, rash and wound.  Neurological: Negative for dizziness, tremors, weakness and light-headedness.  Hematological: Negative for adenopathy. Does not bruise/bleed easily.  Psychiatric/Behavioral: Negative for behavioral problems, confusion, sleep disturbance, dysphoric mood, decreased concentration and agitation.       Objective:   Physical Exam BP 163/89  Pulse 86  Temp(Src) 98.5 F (36.9 C) (Oral)  Ht 4\' 8"  (1.422 m)  Wt 217 lb (98.431 kg)  BMI 48.68 kg/m2  LMP 07/20/2014 Physical Exam  Constitutional:  oriented to person, place, and time. appears well-developed and well-nourished. No distress.  HENT:  Mouth/Throat: Oropharynx is clear and moist. No oropharyngeal exudate.  Cardiovascular: Normal rate, regular rhythm and normal heart sounds. Exam reveals no gallop and no friction rub.  No murmur heard.  Pulmonary/Chest: Effort normal and breath sounds normal. No respiratory distress.  has no wheezes.  Abdominal: Soft. Bowel sounds are normal.  exhibits no distension. There is no tenderness.  Lymphadenopathy: no cervical adenopathy.  Neurological: alert and oriented  to person, place, and time.  Skin: Skin is warm and dry. No rash noted. No erythema.  Psychiatric: a normal mood and affect.behavior  is normal.       Assessment & Plan:  Lyme disease - although lyme disease is rare in [place], we are seeing it more frequently. Patient was appropriately treated for lyme disease and has finished 20 day course of doxycycline. She is still feeling fatigued, which does occur in a small subset of patients. I suspect she will continue to improve. Recommended to use insect repellant, long sleeves if she thinks she may be exposed to tick in the future, since RMSF and ehrlichiosis are more prevalent in [place]. No further treatment is needed.

## 2014-07-21 ENCOUNTER — Ambulatory Visit: Payer: Self-pay | Admitting: Internal Medicine

## 2014-08-10 ENCOUNTER — Ambulatory Visit (INDEPENDENT_AMBULATORY_CARE_PROVIDER_SITE_OTHER): Payer: BC Managed Care – PPO | Admitting: Family Medicine

## 2014-08-10 VITALS — BP 162/104 | HR 102 | Temp 98.3°F | Resp 18 | Ht <= 58 in | Wt 215.6 lb

## 2014-08-10 DIAGNOSIS — I10 Essential (primary) hypertension: Secondary | ICD-10-CM | POA: Insufficient documentation

## 2014-08-10 DIAGNOSIS — R6252 Short stature (child): Secondary | ICD-10-CM | POA: Insufficient documentation

## 2014-08-10 DIAGNOSIS — R300 Dysuria: Secondary | ICD-10-CM

## 2014-08-10 LAB — POCT URINALYSIS DIPSTICK
Bilirubin, UA: NEGATIVE
Blood, UA: NEGATIVE
GLUCOSE UA: NEGATIVE
Ketones, UA: NEGATIVE
Nitrite, UA: NEGATIVE
PH UA: 6
Protein, UA: 30
Spec Grav, UA: 1.02
Urobilinogen, UA: 0.2

## 2014-08-10 LAB — POCT WET PREP WITH KOH
KOH Prep POC: NEGATIVE
RBC Wet Prep HPF POC: NEGATIVE
Trichomonas, UA: NEGATIVE
YEAST WET PREP PER HPF POC: NEGATIVE

## 2014-08-10 LAB — POCT UA - MICROSCOPIC ONLY
Casts, Ur, LPF, POC: NEGATIVE
Crystals, Ur, HPF, POC: NEGATIVE
Mucus, UA: NEGATIVE
RBC, urine, microscopic: NEGATIVE
Yeast, UA: NEGATIVE

## 2014-08-10 MED ORDER — FLUCONAZOLE 150 MG PO TABS: 150.0000 mg | ORAL_TABLET | Freq: Once | ORAL | Status: DC

## 2014-08-10 MED ORDER — AMLODIPINE BESYLATE 5 MG PO TABS: 5.0000 mg | ORAL_TABLET | Freq: Every day | ORAL | Status: DC

## 2014-08-10 NOTE — Patient Instructions (Signed)
We will start you on norvasc 5mg  for your blood pressure.  I will give you a call with your wet prep and will call in any needed medication to your drug store

## 2014-08-10 NOTE — Progress Notes (Addendum)
[facility] [address], [place] [address] [phone]  Date:  08/10/2014   Name:  Lisa Flores   DOB:  1961-06-22   MRN:  [id number]  PCP:  PROVIDER NOT IN SYSTEM    Chief Complaint: Hypertension, Vaginal itchiness and Burning with urination   History of Present Illness:  Lisa Flores is a 53 y.o. very pleasant female patient who presents with the following:  Here today with a possible UTI or yeast infection.   She has noted burning and itching, and feels like there is some vaginal discharge although she does not see any. She has not noted any urgency or hematuria. urination does hurt but she thinks this is from the urine touching her skin.   She has been under some stress and has noted that her BP has been high. She thinks her BP had generally beein the 140s prior to her Lyme disease.  She has now completed her course of doxycycline and is cured.  She was treated by ID for lyme that she apparently caught in [place] She has never been on BP medication in the past. There is some family history of HTN but not strong.    Patient Active Problem List   Diagnosis Date Noted  . BMI 45.0-49.9, adult 06/13/2014  . 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 Heart murmur     Past Surgical History  Procedure Laterality Date  . Cesarean section    . Tonsillectomy and adenoidectomy    . Tumor removal      on back  . Tubal ligation      History  Substance Use Topics  . Smoking status: Never Smoker   . Smokeless tobacco: Not on file  . Alcohol Use: No    Family History  Problem Relation Age of Onset  . Cancer Maternal Grandmother     No Known Allergies  Medication list has been reviewed and updated.  Current Outpatient Prescriptions on File Prior to Visit  Medication Sig Dispense Refill  . meloxicam (MOBIC) 15 MG tablet Take 15 mg by mouth daily.       No current  facility-administered medications on file prior to visit.    Review of Systems:  As per HPI- otherwise negative.   Physical Examination: Filed Vitals:   08/10/14 1322  BP: 162/104  Pulse: 102  Temp: 98.3 F (36.8 C)  Resp: 18   Filed Vitals:   08/10/14 1322  Height: 4' 7.5" (1.41 m)  Weight: 215 lb 9.6 oz (97.796 kg)   Body mass index is 49.19 kg/(m^2). Ideal Body Weight: Weight in (lb) to have BMI = 25: 109.3  GEN: WDWN, NAD, Non-toxic, A & O x 3, obese, short stature HEENT: Atraumatic, Normocephalic. Neck supple. No masses, No LAD. Ears and Nose: No external deformity. CV: RRR, No M/G/R. No JVD. No thrill. No extra heart sounds. PULM: CTA B, no wheezes, crackles, rhonchi. No retractions. No resp. distress. No accessory muscle use. ABD: S, NT, ND, +BS. No rebound. No HSM. EXTR: No c/c/e NEURO Normal gait.  PSYCH: Normally interactive. Conversant. Not depressed or anxious appearing.  Calm demeanor.  Pelvic: normal, no vaginal lesions or discharge. Uterus normal, no CMT, no adnexal tendereness or masses  EKG: NSR, no St elevation or depression  Results for orders placed in visit on 08/10/14  POCT UA - MICROSCOPIC ONLY  Result Value Ref Range   WBC, Ur, HPF, POC 6-10     RBC, urine, microscopic neg     Bacteria, U Microscopic trace     Mucus, UA neg     Epithelial cells, urine per micros 3-5     Crystals, Ur, HPF, POC neg     Casts, Ur, LPF, POC neg     Yeast, UA neg    POCT URINALYSIS DIPSTICK      Result Value Ref Range   Color, UA yellow     Clarity, UA clear     Glucose, UA neg     Bilirubin, UA neg     Ketones, UA neg     Spec Grav, UA 1.020     Blood, UA neg     pH, UA 6.0     Protein, UA 30     Urobilinogen, UA 0.2     Nitrite, UA neg     Leukocytes, UA small (1+)    POCT WET PREP WITH KOH      Result Value Ref Range   Trichomonas, UA Negative     Clue Cells Wet Prep HPF POC 1-4     Epithelial Wet Prep HPF POC 6-10     Yeast Wet Prep HPF  POC neg     Bacteria Wet Prep HPF POC 1+     RBC Wet Prep HPF POC neg     WBC Wet Prep HPF POC 3-15     KOH Prep POC Negative     Assessment and Plan: Burning with urination - Plan: POCT UA - Microscopic Only, POCT urinalysis dipstick, Urine culture, Pap IG, CT/NG NAA, and HPV (high risk), POCT Wet Prep with KOH, fluconazole (DIFLUCAN) 150 MG tablet  Essential hypertension - Plan: EKG 12-Lead, amLODipine (NORVASC) 5 MG tablet  Short stature  Start norvasc 5mg  for HTN, plan follow-up in about 3 weeks.  EKG is normal.  Recent CMP is acceptable Her urine and wet prep are non- specific.  However her recent 3 week course of doxycycline suggests monilia. Will treat with diflucan, defer abx while we await urine culture.  She planned to get a pap in 2 months so did for her today at her request.    Signed Lamar Blinks, MD  8/30; called and left detailed message.  Labs good but she did grow mixed bacteria in her urine.  Would base treatment here on sx- if she continues to have any urinary sx please give me a call and I will call in abx.

## 2014-08-11 LAB — URINE CULTURE: Colony Count: >=100000

## 2014-08-12 LAB — PAP IG, CT-NG NAA, HPV HIGH-RISK
Chlamydia Probe Amp: NEGATIVE
GC Probe Amp: NEGATIVE
HPV DNA HIGH RISK: NOT DETECTED

## 2014-08-14 ENCOUNTER — Encounter: Payer: Self-pay | Admitting: Family Medicine

## 2014-08-26 ENCOUNTER — Ambulatory Visit (INDEPENDENT_AMBULATORY_CARE_PROVIDER_SITE_OTHER): Payer: BC Managed Care – PPO | Admitting: Family Medicine

## 2014-08-26 ENCOUNTER — Encounter: Payer: Self-pay | Admitting: Family Medicine

## 2014-08-26 VITALS — BP 144/86 | HR 72 | Temp 98.3°F | Resp 17 | Ht <= 58 in | Wt 210.0 lb

## 2014-08-26 DIAGNOSIS — I10 Essential (primary) hypertension: Secondary | ICD-10-CM

## 2014-08-26 DIAGNOSIS — Z23 Encounter for immunization: Secondary | ICD-10-CM

## 2014-08-26 DIAGNOSIS — N898 Other specified noninflammatory disorders of vagina: Secondary | ICD-10-CM

## 2014-08-26 DIAGNOSIS — E669 Obesity, unspecified: Secondary | ICD-10-CM

## 2014-08-26 DIAGNOSIS — N899 Noninflammatory disorder of vagina, unspecified: Secondary | ICD-10-CM

## 2014-08-26 LAB — COMPREHENSIVE METABOLIC PANEL
ALT: 26 U/L (ref 0–35)
AST: 16 U/L (ref 0–37)
Albumin: 4.3 g/dL (ref 3.5–5.2)
Alkaline Phosphatase: 48 U/L (ref 39–117)
BUN: 12 mg/dL (ref 6–23)
CALCIUM: 9.3 mg/dL (ref 8.4–10.5)
CHLORIDE: 106 meq/L (ref 96–112)
CO2: 28 mEq/L (ref 19–32)
CREATININE: 0.51 mg/dL (ref 0.50–1.10)
Glucose, Bld: 89 mg/dL (ref 70–99)
Potassium: 3.5 mEq/L (ref 3.5–5.3)
Sodium: 141 mEq/L (ref 135–145)
Total Bilirubin: 0.9 mg/dL (ref 0.2–1.2)
Total Protein: 6.8 g/dL (ref 6.0–8.3)

## 2014-08-26 LAB — HEMOGLOBIN A1C
Hgb A1c MFr Bld: 5.5 % (ref <5.7)
Mean Plasma Glucose: 111 mg/dL (ref <117)

## 2014-08-26 MED ORDER — FLUCONAZOLE 150 MG PO TABS: 150.0000 mg | ORAL_TABLET | Freq: Once | ORAL | Status: DC

## 2014-08-26 NOTE — Progress Notes (Signed)
[facility] [address] [place] [address] [phone]  Date:  08/26/2014   Name:  Lisa Flores   DOB:  11/04/1961   MRN:  [id number]  PCP:  PROVIDER NOT IN SYSTEM    Chief Complaint: Hypertension and Vaginitis   History of Present Illness:  Lisa Flores is a 53 y.o. very pleasant female patient who presents with the following:  Started on Novvasc 5mg  at our last visit on 8/26- at that time her BP was 162/104.  We also treated her for yeast with diflucan.   She felt better as far as her vaginitis following treatment with diflucan.  This seemed to resolve, but then she used a different detergetn recetly and seemed to have some irriation again.  She notes some changes of the skin around around her groin/ vulva.  No discharge.    Patient Active Problem List   Diagnosis Date Noted  . Short stature 08/10/2014  . HTN (hypertension) 08/10/2014  . BMI 45.0-49.9, adult 06/13/2014  . 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 Heart murmur     Past Surgical History  Procedure Laterality Date  . Cesarean section    . Tonsillectomy and adenoidectomy    . Tumor removal      on back  . Tubal ligation      History  Substance Use Topics  . Smoking status: Never Smoker   . Smokeless tobacco: Not on file  . Alcohol Use: No    Family History  Problem Relation Age of Onset  . Cancer Maternal Grandmother     No Known Allergies  Medication list has been reviewed and updated.  Current Outpatient Prescriptions on File Prior to Visit  Medication Sig Dispense Refill  . amLODipine (NORVASC) 5 MG tablet Take 1 tablet (5 mg total) by mouth daily.  30 tablet  5  . meloxicam (MOBIC) 15 MG tablet Take 15 mg by mouth daily.       No current facility-administered medications on file prior to visit.    Review of Systems:  As per HPI- otherwise negative.   Physical Examination: Filed  Vitals:   08/26/14 1013  BP: 144/86  Pulse: 72  Temp: 98.3 F (36.8 C)  Resp: 17   Filed Vitals:   08/26/14 1013  Height: 4\' 8"  (1.422 m)  Weight: 210 lb (95.255 kg)   Body mass index is 47.11 kg/(m^2). Ideal Body Weight: Weight in (lb) to have BMI = 25: 111.3  GEN: WDWN, NAD, Non-toxic, A & O x 3, obese, short stature HEENT: Atraumatic, Normocephalic. Neck supple. No masses, No LAD. Ears and Nose: No external deformity. CV: RRR, No M/G/R. No JVD. No thrill. No extra heart sounds. PULM: CTA B, no wheezes, crackles, rhonchi. No retractions. No resp. distress. No accessory muscle use. EXTR: No c/c/e NEURO Normal gait.  PSYCH: Normally interactive. Conversant. Not depressed or anxious appearing.  Calm demeanor.  GU: mild inflammation of the external vulva, no vaginal discharge or redness on speculum exam  Assessment and Plan: Vaginal irritation - Plan: fluconazole (DIFLUCAN) 150 MG tablet  Obesity, unspecified - Plan: Hemoglobin A1C  Essential hypertension - Plan: Comprehensive metabolic panel  Screen for DM as she is obese and seems to be having frequent vaginal irriation BP is ok for now Will plan further follow- up pending labs. Use diflucan once a week as needed  Signed Lamar Blinks, MD

## 2014-08-26 NOTE — Patient Instructions (Signed)
We will try another round of diflucan for your vaginal irritation- take one weekly as needed, let me know if you are not better soon! I am going to check some labs to make sure that you do not have any other electrolyte problems and also to rule- out diabetes.   For the time being continue your same dose of norvasc for your BP.  If you could check it a few times at the drug store to give [place] some more readings that would be helpful

## 2014-11-02 ENCOUNTER — Other Ambulatory Visit: Payer: Self-pay | Admitting: Family Medicine

## 2014-11-08 ENCOUNTER — Other Ambulatory Visit: Payer: Self-pay | Admitting: Internal Medicine

## 2014-11-08 DIAGNOSIS — IMO0002 Reserved for concepts with insufficient information to code with codable children: Secondary | ICD-10-CM

## 2014-11-16 ENCOUNTER — Ambulatory Visit
Admission: RE | Admit: 2014-11-16 | Discharge: 2014-11-16 | Disposition: A | Discharge status: Home / Self Care | Payer: BC Managed Care – PPO | Source: Ambulatory Visit | Attending: Internal Medicine | Admitting: Internal Medicine

## 2014-11-16 ENCOUNTER — Other Ambulatory Visit: Payer: Self-pay | Admitting: Internal Medicine

## 2014-11-16 DIAGNOSIS — IMO0002 Reserved for concepts with insufficient information to code with codable children: Secondary | ICD-10-CM

## 2014-11-17 ENCOUNTER — Other Ambulatory Visit: Payer: Self-pay | Admitting: Internal Medicine

## 2014-11-17 DIAGNOSIS — R1900 Intra-abdominal and pelvic swelling, mass and lump, unspecified site: Secondary | ICD-10-CM

## 2014-11-24 ENCOUNTER — Ambulatory Visit
Admission: RE | Admit: 2014-11-24 | Discharge: 2014-11-24 | Disposition: A | Discharge status: Home / Self Care | Payer: BC Managed Care – PPO | Source: Ambulatory Visit | Attending: Internal Medicine | Admitting: Internal Medicine

## 2014-11-24 DIAGNOSIS — R1900 Intra-abdominal and pelvic swelling, mass and lump, unspecified site: Secondary | ICD-10-CM

## 2014-11-24 MED ORDER — IOHEXOL 300 MG/ML  SOLN
125.0000 mL | Freq: Once | INTRAMUSCULAR | Status: AC | PRN
  Administered 2014-11-24: 125 mL via INTRAVENOUS

## 2014-12-13 ENCOUNTER — Other Ambulatory Visit: Payer: Self-pay | Admitting: Gastroenterology

## 2014-12-23 ENCOUNTER — Other Ambulatory Visit (HOSPITAL_COMMUNITY): Payer: Self-pay | Admitting: Obstetrics and Gynecology

## 2014-12-23 DIAGNOSIS — R1900 Intra-abdominal and pelvic swelling, mass and lump, unspecified site: Secondary | ICD-10-CM

## 2015-01-03 ENCOUNTER — Other Ambulatory Visit (HOSPITAL_COMMUNITY): Payer: Self-pay | Admitting: Obstetrics and Gynecology

## 2015-01-03 ENCOUNTER — Ambulatory Visit (HOSPITAL_COMMUNITY)
Admission: RE | Admit: 2015-01-03 | Discharge: 2015-01-03 | Disposition: A | Discharge status: Home / Self Care | Payer: BLUE CROSS/BLUE SHIELD | Source: Ambulatory Visit | Attending: Obstetrics and Gynecology | Admitting: Obstetrics and Gynecology

## 2015-01-03 DIAGNOSIS — R1900 Intra-abdominal and pelvic swelling, mass and lump, unspecified site: Secondary | ICD-10-CM | POA: Diagnosis not present

## 2015-01-03 LAB — POCT I-STAT CREATININE: Creatinine, Ser: 0.6 mg/dL (ref 0.50–1.10)

## 2015-01-03 MED ORDER — GADOBENATE DIMEGLUMINE 529 MG/ML IV SOLN
20.0000 mL | Freq: Once | INTRAVENOUS | Status: AC | PRN
  Administered 2015-01-03: 20 mL via INTRAVENOUS

## 2015-01-16 ENCOUNTER — Encounter (INDEPENDENT_AMBULATORY_CARE_PROVIDER_SITE_OTHER): Payer: Self-pay | Admitting: General Surgery

## 2015-01-16 NOTE — Progress Notes (Signed)
Patient ID: Lisa Flores, female   DOB: [birth year], 54 y.o.   MRN: [id number]  Lisa Flores 01/16/2015 2:20 PM Location: [facility] Patient #: [id number] DOB: 27-Sep-1961 Married / Language: Lisa Flores / Race: White Female History of Present Illness Odis Hollingshead MD; 01/16/2015 3:00 PM) Patient words: Evaluate ventral hernia.  The patient is a 54 year old female    Note:She is referred by Dr. Willis Modena because of an incisional hernia. She's had 4 previous cesarean sections. She noted a bulge in the lower abdomen. CT scan demonstrated a ventral hernia containing fatty tissue. It also demonstrated a uterine mass. This mass is ill defined. The she's been evaluated by Dr. Willis Modena. She is going to need a hysterectomy. I've been asked to see her regarding repairing the incisional hernia during the same setting. She denies any constipation or difficulty with urination.  Other Problems Lisa Flores, [place]; 01/16/2015 2:20 PM) Arthritis Depression Heart murmur High blood pressure Migraine Headache Ventral Hernia Repair  Past Surgical History Lisa Flores, [place]; 01/16/2015 2:20 PM) Cesarean Section - Multiple Colon Polyp Removal - Open Tonsillectomy  Diagnostic Studies History Lisa Flores, [place]; 01/16/2015 2:20 PM) Colonoscopy within last year Mammogram never Pap Smear 1-5 years ago  Allergies Lisa Flores, [place]; 01/16/2015 2:21 PM) No Known Drug Allergies 01/16/2015  Medication History Lisa Flores, [place]; 01/16/2015 2:22 PM) AmLODIPine Besylate (10MG  Tablet, Oral) Active. FLUoxetine HCl (20MG  Capsule, Oral) Active. Meloxicam (15MG  Tablet, Oral) Active.  Social History Lisa Flores [place], [place]; 01/16/2015 2:20 PM) Alcohol use Occasional alcohol use. No caffeine use No drug use Tobacco use Never smoker.  Family History Lisa Flores, [place]; 01/16/2015 2:20 PM) Hypertension Father. Migraine Headache Mother. Prostate Cancer Father.  Pregnancy / Birth  History Lisa Flores, [place]; 01/16/2015 2:20 PM) Age at menarche 87 years. Contraceptive History Depo-provera, Oral contraceptives. Gravida 4 Irregular periods Maternal age 46-25 Para 4     Review of Systems Lisa Flores [place]; 01/16/2015 2:20 PM) General Not Present- Appetite Loss, Chills, Fatigue, Fever, Night Sweats, Weight Gain and Weight Loss. Skin Not Present- Change in Wart/Mole, Dryness, Hives, Jaundice, New Lesions, Non-Healing Wounds, Rash and Ulcer. HEENT Not Present- Earache, Hearing Loss, Hoarseness, Nose Bleed, Oral Ulcers, Ringing in the Ears, Seasonal Allergies, Sinus Pain, Sore Throat, Visual Disturbances, Wears glasses/contact lenses and Yellow Eyes. Respiratory Not Present- Bloody sputum, Chronic Cough, Difficulty Breathing, Snoring and Wheezing. Breast Not Present- Breast Mass, Breast Pain, Nipple Discharge and Skin Changes. Gastrointestinal Present- Abdominal Pain. Not Present- Bloating, Bloody Stool, Change in Bowel Habits, Chronic diarrhea, Constipation, Difficulty Swallowing, Excessive gas, Gets full quickly at meals, Hemorrhoids, Indigestion, Nausea, Rectal Pain and Vomiting. Female Genitourinary Present- Frequency and Pelvic Pain. Not Present- Nocturia, Painful Urination and Urgency. Musculoskeletal Not Present- Back Pain, Joint Pain, Joint Stiffness, Muscle Pain, Muscle Weakness and Swelling of Extremities. Neurological Not Present- Decreased Memory, Fainting, Headaches, Numbness, Seizures, Tingling, Tremor, Trouble walking and Weakness. Psychiatric Present- Anxiety, Change in Sleep Pattern, Depression and Frequent crying. Not Present- Bipolar and Fearful. Endocrine Present- Hot flashes. Not Present- Cold Intolerance, Excessive Hunger, Hair Changes, Heat Intolerance and New Diabetes. Hematology Present- Easy Bruising. Not Present- Excessive bleeding, Gland problems, HIV and Persistent Infections.  Vitals Lisa Costa MA; 01/16/2015 2:20 PM) 01/16/2015 2:20  PM Weight: 205.2 lb Height: 56in Body Surface Area: 1.92 m Body Mass Index: 46 kg/m Temp.: 97.68F(Temporal)  Pulse: 72 (Regular)  Resp.: 16 (Unlabored)  BP: 140/86 (Sitting, Left Arm, Standard)     Physical Exam Odis Hollingshead MD; 01/16/2015 3:03 PM)  The physical exam findings are as follows: Note:General: Overweight female in NAD. 4'9" tall, weight = 205.2 lbs. Pleasant and cooperative.  HEENT: [place]/AT  CV: RRR, no murmur, no JVD.  CHEST: Breath sounds equal and clear. Respirations nonlabored.  ABDOMEN: Soft, obese, lower abdominal mass that is not able to be reduced , lower transverse scar  SKIN: No jaundice or suspicious rashes.  NEUROLOGIC: Alert and oriented, answers questions appropriately, normal gait and station.  PSYCHIATRIC: Normal mood, affect , and behavior.    Assessment & Plan Odis Hollingshead MD; 01/16/2015 3:05 PM)  INCISIONAL HERNIA, WITHOUT OBSTRUCTION OR GANGRENE (553.21  K43.2) Impression: She has a uterine tumor as well. I spoke with Dr. Willis Modena about this. Plan is to do a hysterectomy and repair of the incisional hernia. I discussed with her that I would not use mesh since would be a class II wound and I did not recommend putting a prosthetic in anything other than a class I wound. We did talk about the fact that the recurrence rate could be higher than if mesh was used.  Plan: Incisional hernia repair with mesh in coordinate with Dr. Willis Modena doing the hysterectomy. I have discussed the procedure, risks, and aftercare. Risks include but are not limited to bleeding, infection, wound healing problems, anesthesia, recurrence, accidental injury to intra-abdominal organs. All questions were answered.  Current Plans Free Text Instructions : discussed with patient and provided information. Schedule for Surgery  Lisa Confer, MD

## 2015-02-24 ENCOUNTER — Encounter (HOSPITAL_COMMUNITY): Payer: Self-pay

## 2015-02-24 ENCOUNTER — Encounter (HOSPITAL_COMMUNITY)
Admission: RE | Admit: 2015-02-24 | Discharge: 2015-02-24 | Disposition: A | Discharge status: Home / Self Care | Payer: BLUE CROSS/BLUE SHIELD | Source: Ambulatory Visit | Attending: General Surgery | Admitting: General Surgery

## 2015-02-24 DIAGNOSIS — R1900 Intra-abdominal and pelvic swelling, mass and lump, unspecified site: Secondary | ICD-10-CM | POA: Insufficient documentation

## 2015-02-24 DIAGNOSIS — Z01812 Encounter for preprocedural laboratory examination: Secondary | ICD-10-CM | POA: Diagnosis not present

## 2015-02-24 HISTORY — DX: Headache: R51

## 2015-02-24 HISTORY — DX: Lyme disease, unspecified: A69.20

## 2015-02-24 HISTORY — DX: Headache, unspecified: R51.9

## 2015-02-24 LAB — COMPREHENSIVE METABOLIC PANEL
ALK PHOS: 57 U/L (ref 39–117)
ALT: 14 U/L (ref 0–35)
ANION GAP: 6 (ref 5–15)
AST: 17 U/L (ref 0–37)
Albumin: 4 g/dL (ref 3.5–5.2)
BUN: 22 mg/dL (ref 6–23)
CALCIUM: 9.3 mg/dL (ref 8.4–10.5)
CO2: 22 mmol/L (ref 19–32)
Chloride: 109 mmol/L (ref 96–112)
Creatinine, Ser: 0.57 mg/dL (ref 0.50–1.10)
GLUCOSE: 106 mg/dL — AB (ref 70–99)
POTASSIUM: 4.2 mmol/L (ref 3.5–5.1)
Sodium: 137 mmol/L (ref 135–145)
Total Bilirubin: 0.9 mg/dL (ref 0.3–1.2)
Total Protein: 7.2 g/dL (ref 6.0–8.3)

## 2015-02-24 LAB — CBC WITH DIFFERENTIAL/PLATELET
BASOS PCT: 0 % (ref 0–1)
Basophils Absolute: 0 10*3/uL (ref 0.0–0.1)
Eosinophils Absolute: 0.1 10*3/uL (ref 0.0–0.7)
Eosinophils Relative: 1 % (ref 0–5)
HCT: 40.4 % (ref 36.0–46.0)
HEMOGLOBIN: 12.9 g/dL (ref 12.0–15.0)
Lymphocytes Relative: 21 % (ref 12–46)
Lymphs Abs: 1.9 10*3/uL (ref 0.7–4.0)
MCH: 28.4 pg (ref 26.0–34.0)
MCHC: 31.9 g/dL (ref 30.0–36.0)
MCV: 88.8 fL (ref 78.0–100.0)
MONOS PCT: 6 % (ref 3–12)
Monocytes Absolute: 0.5 10*3/uL (ref 0.1–1.0)
NEUTROS ABS: 6.4 10*3/uL (ref 1.7–7.7)
NEUTROS PCT: 72 % (ref 43–77)
PLATELETS: 283 10*3/uL (ref 150–400)
RBC: 4.55 MIL/uL (ref 3.87–5.11)
RDW: 13.8 % (ref 11.5–15.5)
WBC: 8.9 10*3/uL (ref 4.0–10.5)

## 2015-02-24 LAB — PROTIME-INR
INR: 0.95 (ref 0.00–1.49)
Prothrombin Time: 12.7 seconds (ref 11.6–15.2)

## 2015-02-24 LAB — ABO/RH: ABO/RH(D): A POS

## 2015-02-24 LAB — HCG, SERUM, QUALITATIVE: PREG SERUM: NEGATIVE

## 2015-02-24 NOTE — Progress Notes (Signed)
   02/24/15 1040  OBSTRUCTIVE SLEEP APNEA  Have you ever been diagnosed with sleep apnea through a sleep study? No  Do you snore loudly (loud enough to be heard through closed doors)?  0  Do you often feel tired, fatigued, or sleepy during the daytime? 1  Has anyone observed you stop breathing during your sleep? 0  Do you have, or are you being treated for high blood pressure? 1  BMI more than 35 kg/m2? 1  Age over 52 years old? 1  Neck circumference greater than 40 cm/16 inches? 0  Gender: 0

## 2015-02-24 NOTE — Patient Instructions (Addendum)
Lisa Flores  02/24/2015   Your procedure is scheduled on: Thursday 03/02/2015  Report to [facility] Main  Entrance and follow signs to               [place] at  0530AM.  Call this number if you have problems the morning of surgery [phone]   Remember:  Do not eat food or drink liquids :After Midnight.     Take these medicines the morning of surgery with A SIP OF WATER: Amlodipine, Prozac                               You may not have any metal on your body including hair pins and              piercings  Do not wear jewelry, make-up, lotions, powders or perfumes.             Do not wear nail polish.  Do not shave  48 hours prior to surgery.              Men may shave face and neck.   Do not bring valuables to the hospital. [place].  Contacts, dentures or bridgework may not be worn into surgery.  Leave suitcase in the car. After surgery it may be brought to your room.     Patients discharged the day of surgery will not be allowed to drive home.  Name and phone number of your driver:  Special Instructions: N/A              Please read over the following fact sheets you were given: _____________________________________________________________________             [facility] - Preparing for Surgery Before surgery, you can play an important role.  Because skin is not sterile, your skin needs to be as free of germs as possible.  You can reduce the number of germs on your skin by washing with CHG (chlorahexidine gluconate) soap before surgery.  CHG is an antiseptic cleaner which kills germs and bonds with the skin to continue killing germs even after washing. Please DO NOT use if you have an allergy to CHG or antibacterial soaps.  If your skin becomes reddened/irritated stop using the CHG and inform your nurse when you arrive at Short Stay. Do not shave (including legs and underarms) for at least  48 hours prior to the first CHG shower.  You may shave your face/neck. Please follow these instructions carefully:  1.  Shower with CHG Soap the night before surgery and the  morning of Surgery.  2.  If you choose to wash your hair, wash your hair first as usual with your  normal  shampoo.  3.  After you shampoo, rinse your hair and body thoroughly to remove the  shampoo.                           4.  Use CHG as you would any other liquid soap.  You can apply chg directly  to the skin and wash                       Gently  with a scrungie or clean washcloth.  5.  Apply the CHG Soap to your body ONLY FROM THE NECK DOWN.   Do not use on face/ open                           Wound or open sores. Avoid contact with eyes, ears mouth and genitals (private parts).                       Wash face,  Genitals (private parts) with your normal soap.             6.  Wash thoroughly, paying special attention to the area where your surgery  will be performed.  7.  Thoroughly rinse your body with warm water from the neck down.  8.  DO NOT shower/wash with your normal soap after using and rinsing off  the CHG Soap.                9.  Pat yourself dry with a clean towel.            10.  Wear clean pajamas.            11.  Place clean sheets on your bed the night of your first shower and do not  sleep with pets. Day of Surgery : Do not apply any lotions/deodorants the morning of surgery.  Please wear clean clothes to the hospital/surgery center.  FAILURE TO FOLLOW THESE INSTRUCTIONS MAY RESULT IN THE CANCELLATION OF YOUR SURGERY PATIENT SIGNATURE_________________________________  NURSE SIGNATURE__________________________________  ________________________________________________________________________   Adam Phenix  An incentive spirometer is a tool that can help keep your lungs clear and active. This tool measures how well you are filling your lungs with each breath. Taking long deep breaths may  help reverse or decrease the chance of developing breathing (pulmonary) problems (especially infection) following:  A long period of time when you are unable to move or be active. BEFORE THE PROCEDURE   If the spirometer includes an indicator to show your best effort, your nurse or respiratory therapist will set it to a desired goal.  If possible, sit up straight or lean slightly forward. Try not to slouch.  Hold the incentive spirometer in an upright position. INSTRUCTIONS FOR USE  1. Sit on the edge of your bed if possible, or sit up as far as you can in bed or on a chair. 2. Hold the incentive spirometer in an upright position. 3. Breathe out normally. 4. Place the mouthpiece in your mouth and seal your lips tightly around it. 5. Breathe in slowly and as deeply as possible, raising the piston or the ball toward the top of the column. 6. Hold your breath for 3-5 seconds or for as long as possible. Allow the piston or ball to fall to the bottom of the column. 7. Remove the mouthpiece from your mouth and breathe out normally. 8. Rest for a few seconds and repeat Steps 1 through 7 at least 10 times every 1-2 hours when you are awake. Take your time and take a few normal breaths between deep breaths. 9. The spirometer may include an indicator to show your best effort. Use the indicator as a goal to work toward during each repetition. 10. After each set of 10 deep breaths, practice coughing to be sure your lungs are clear. If you have an incision (the cut made at the time of surgery), support  your incision when coughing by placing a pillow or rolled up towels firmly against it. Once you are able to get out of bed, walk around indoors and cough well. You may stop using the incentive spirometer when instructed by your caregiver.  RISKS AND COMPLICATIONS  Take your time so you do not get dizzy or light-headed.  If you are in pain, you may need to take or ask for pain medication before doing  incentive spirometry. It is harder to take a deep breath if you are having pain. AFTER USE  Rest and breathe slowly and easily.  It can be helpful to keep track of a log of your progress. Your caregiver can provide you with a simple table to help with this. If you are using the spirometer at home, follow these instructions: [place] IF:   You are having difficultly using the spirometer.  You have trouble using the spirometer as often as instructed.  Your pain medication is not giving enough relief while using the spirometer.  You develop fever of 100.5 F (38.1 C) or higher. SEEK IMMEDIATE MEDICAL CARE IF:   You cough up bloody sputum that had not been present before.  You develop fever of 102 F (38.9 C) or greater.  You develop worsening pain at or near the incision site. MAKE SURE YOU:   Understand these instructions.  Will watch your condition.  Will get help right away if you are not doing well or get worse. Document Released: 04/14/2007 Document Revised: 02/24/2012 Document Reviewed: 06/15/2007 [facility] Patient Information 2014 [place], [place].   ________________________________________________________________________

## 2015-03-01 NOTE — H&P (Signed)
Lisa Flores is an 54 y.o. female. She was referred in January for evaluation of 8 cm lesion arising from cervix on CT scan. Is having some abdominal and pelvic discomfort, has abdominal mass which prompted CT scan. Was having regular menses monthly, then went 4 months without menses, but since November bleeding every 2-3 weeks. CT scan also revealed an abdominal wall hernia from c-sections.  Pelvic ultrasound confirmed a 7-8 cm mass from anterior cervix, but still difficult to tell etiology.  Pelvic MRI confirms 7 cm mass arising from anterior lower uterine segment most consistent with a fibroid.  Endometrial biopsy is benign.    Pertinent Gynecological History: OB History: G4, P4004 4 c-sections at term   Menstrual History: No LMP recorded.    Past Medical History  Diagnosis Date  . 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 Heart murmur   . Headache     migraines  . Lyme disease     Past Surgical History  Procedure Laterality Date  . Cesarean section      x 4  . Tonsillectomy and adenoidectomy    . Tumor removal      on back x 2  . Tubal ligation      Family History  Problem Relation Age of Onset  . Cancer Maternal Grandmother     Social History:  reports that she has never smoked. She does not have any smokeless tobacco history on file. She reports that she drinks alcohol. She reports that she does not use illicit drugs.  Allergies: No Known Allergies  No prescriptions prior to admission    Review of Systems  Respiratory: Negative.   Cardiovascular: Negative.   Gastrointestinal: Negative.   Genitourinary: Negative.     There were no vitals taken for this visit. Physical Exam  Constitutional: She appears well-developed and well-nourished.  Cardiovascular: Normal rate, regular rhythm and normal heart sounds.   No murmur heard. Respiratory: Effort normal and breath sounds normal. No respiratory distress. She has no wheezes.  GI: Soft. She exhibits no distension.  Mass: firm, mobile mass superior to transverse scar. There is no tenderness.  Genitourinary: Vagina normal.  Uterus and adnexa are difficult to palpate, but there does seem to be a mass arising from her anterior cervix/lower uterine segment    No results found for this or any previous visit (from the past 24 hour(s)).  No results found.  Assessment/Plan: 7 cm mass anterior lower uterine segment most consistent with fibroid with some discomfort and abnormal bleeding with benign pipelle.  Will proceed with TAH and bilateral salpingectomy, leave ovaries unless they are abnormal.  I will be assisting Dr. Zella Richer with repair of her abdominal wall hernia and he will assist me with the hysterectomy.  The surgical procedure, risks, alternatives and chances of relieving gynecologic symptoms have all been discussed.    Caydon Feasel D 03/01/2015, 7:20 PM

## 2015-03-01 NOTE — Anesthesia Preprocedure Evaluation (Addendum)
Anesthesia Evaluation  Patient identified by MRN, date of birth, ID band Patient awake    Reviewed: Allergy & Precautions, NPO status   Airway Mallampati: II       Dental  (+) Teeth Intact   Pulmonary  breath sounds clear to auscultation        Cardiovascular hypertension, Rhythm:Regular Rate:Normal     Neuro/Psych    GI/Hepatic   Endo/Other  Morbid obesity  Renal/GU      Musculoskeletal   Abdominal (+) + obese,   Peds  Hematology   Anesthesia Other Findings   Reproductive/Obstetrics                            Anesthesia Physical Anesthesia Plan  ASA: II  Anesthesia Plan: General   Post-op Pain Management:    Induction: Intravenous  Airway Management Planned: Oral ETT  Additional Equipment:   Intra-op Plan:   Post-operative Plan: Extubation in OR  Informed Consent: I have reviewed the patients History and Physical, chart, labs and discussed the procedure including the risks, benefits and alternatives for the proposed anesthesia with the patient or authorized representative who has indicated his/her understanding and acceptance.   Dental advisory given  Plan Discussed with: CRNA and Surgeon  Anesthesia Plan Comments:         Anesthesia Quick Evaluation

## 2015-03-02 ENCOUNTER — Ambulatory Visit (HOSPITAL_COMMUNITY): Payer: BLUE CROSS/BLUE SHIELD | Admitting: Anesthesiology

## 2015-03-02 ENCOUNTER — Encounter (HOSPITAL_COMMUNITY)
Admission: RE | Disposition: A | Discharge status: Home / Self Care | Payer: Self-pay | Source: Ambulatory Visit | Attending: General Surgery

## 2015-03-02 ENCOUNTER — Inpatient Hospital Stay (HOSPITAL_COMMUNITY)
Admission: RE | Admit: 2015-03-02 | Discharge: 2015-03-09 | DRG: 330 | Disposition: A | Discharge status: Home / Self Care | Payer: BLUE CROSS/BLUE SHIELD | Source: Ambulatory Visit | Attending: General Surgery | Admitting: General Surgery

## 2015-03-02 ENCOUNTER — Encounter (HOSPITAL_COMMUNITY): Payer: Self-pay | Admitting: *Deleted

## 2015-03-02 DIAGNOSIS — Z79899 Other long term (current) drug therapy: Secondary | ICD-10-CM | POA: Diagnosis not present

## 2015-03-02 DIAGNOSIS — Z6841 Body Mass Index (BMI) 40.0 and over, adult: Secondary | ICD-10-CM | POA: Diagnosis not present

## 2015-03-02 DIAGNOSIS — E876 Hypokalemia: Secondary | ICD-10-CM | POA: Diagnosis not present

## 2015-03-02 DIAGNOSIS — N179 Acute kidney failure, unspecified: Secondary | ICD-10-CM | POA: Diagnosis not present

## 2015-03-02 DIAGNOSIS — N920 Excessive and frequent menstruation with regular cycle: Secondary | ICD-10-CM | POA: Diagnosis present

## 2015-03-02 DIAGNOSIS — N889 Noninflammatory disorder of cervix uteri, unspecified: Secondary | ICD-10-CM | POA: Diagnosis present

## 2015-03-02 DIAGNOSIS — A6920 Lyme disease, unspecified: Secondary | ICD-10-CM | POA: Diagnosis present

## 2015-03-02 DIAGNOSIS — Y658 Other specified misadventures during surgical and medical care: Secondary | ICD-10-CM | POA: Diagnosis not present

## 2015-03-02 DIAGNOSIS — K559 Vascular disorder of intestine, unspecified: Secondary | ICD-10-CM | POA: Diagnosis not present

## 2015-03-02 DIAGNOSIS — K567 Ileus, unspecified: Secondary | ICD-10-CM | POA: Diagnosis not present

## 2015-03-02 DIAGNOSIS — K432 Incisional hernia without obstruction or gangrene: Principal | ICD-10-CM | POA: Diagnosis present

## 2015-03-02 DIAGNOSIS — G43909 Migraine, unspecified, not intractable, without status migrainosus: Secondary | ICD-10-CM | POA: Diagnosis present

## 2015-03-02 DIAGNOSIS — T888XXA Other specified complications of surgical and medical care, not elsewhere classified, initial encounter: Secondary | ICD-10-CM | POA: Diagnosis not present

## 2015-03-02 DIAGNOSIS — D62 Acute posthemorrhagic anemia: Secondary | ICD-10-CM | POA: Diagnosis not present

## 2015-03-02 HISTORY — PX: BILATERAL SALPINGECTOMY: SHX5743

## 2015-03-02 HISTORY — PX: BOWEL RESECTION: SHX1257

## 2015-03-02 HISTORY — PX: ABDOMINAL HYSTERECTOMY: SHX81

## 2015-03-02 HISTORY — PX: INCISIONAL HERNIA REPAIR: SHX193

## 2015-03-02 LAB — CBC
HCT: 27.5 % — ABNORMAL LOW (ref 36.0–46.0)
HEMOGLOBIN: 9 g/dL — AB (ref 12.0–15.0)
MCH: 29.7 pg (ref 26.0–34.0)
MCHC: 32.7 g/dL (ref 30.0–36.0)
MCV: 90.8 fL (ref 78.0–100.0)
Platelets: 367 10*3/uL (ref 150–400)
RBC: 3.03 MIL/uL — ABNORMAL LOW (ref 3.87–5.11)
RDW: 14.1 % (ref 11.5–15.5)
WBC: 25.8 10*3/uL — ABNORMAL HIGH (ref 4.0–10.5)

## 2015-03-02 SURGERY — REPAIR, HERNIA, INCISIONAL
Anesthesia: General | Site: Abdomen

## 2015-03-02 MED ORDER — DIPHENHYDRAMINE HCL 50 MG/ML IJ SOLN: 12.5000 mg | Freq: Four times a day (QID) | INTRAMUSCULAR | Status: DC | PRN

## 2015-03-02 MED ORDER — MIDAZOLAM HCL 2 MG/2ML IJ SOLN
INTRAMUSCULAR | Status: AC
  Filled 2015-03-02: qty 2

## 2015-03-02 MED ORDER — LIDOCAINE HCL (CARDIAC) 20 MG/ML IV SOLN
INTRAVENOUS | Status: DC | PRN
  Administered 2015-03-02 (×2): 50 mg via INTRAVENOUS

## 2015-03-02 MED ORDER — HYDROMORPHONE HCL 1 MG/ML IJ SOLN
INTRAMUSCULAR | Status: DC | PRN
  Administered 2015-03-02 (×4): 0.5 mg via INTRAVENOUS

## 2015-03-02 MED ORDER — CHLORHEXIDINE GLUCONATE 0.12 % MT SOLN
15.0000 mL | Freq: Two times a day (BID) | OROMUCOSAL | Status: DC
  Administered 2015-03-02 – 2015-03-09 (×11): 15 mL via OROMUCOSAL
  Filled 2015-03-02 (×15): qty 15

## 2015-03-02 MED ORDER — FENTANYL CITRATE 0.05 MG/ML IJ SOLN
INTRAMUSCULAR | Status: AC
  Filled 2015-03-02: qty 5

## 2015-03-02 MED ORDER — SODIUM CHLORIDE 0.9 % IJ SOLN
9.0000 mL | INTRAMUSCULAR | Status: DC | PRN
  Administered 2015-03-06: 9 mL via INTRAVENOUS
  Filled 2015-03-02: qty 9

## 2015-03-02 MED ORDER — LIDOCAINE HCL (CARDIAC) 20 MG/ML IV SOLN
INTRAVENOUS | Status: AC
  Filled 2015-03-02: qty 5

## 2015-03-02 MED ORDER — HEPARIN SODIUM (PORCINE) 5000 UNIT/ML IJ SOLN
5000.0000 [IU] | Freq: Three times a day (TID) | INTRAMUSCULAR | Status: DC
  Filled 2015-03-02: qty 1

## 2015-03-02 MED ORDER — ROCURONIUM BROMIDE 100 MG/10ML IV SOLN
INTRAVENOUS | Status: AC
  Filled 2015-03-02: qty 1

## 2015-03-02 MED ORDER — ALBUTEROL SULFATE HFA 108 (90 BASE) MCG/ACT IN AERS
INHALATION_SPRAY | RESPIRATORY_TRACT | Status: AC
  Filled 2015-03-02: qty 6.7

## 2015-03-02 MED ORDER — OXYCODONE HCL 5 MG PO TABS: 5.0000 mg | ORAL_TABLET | Freq: Once | ORAL | Status: DC | PRN

## 2015-03-02 MED ORDER — NALOXONE HCL 0.4 MG/ML IJ SOLN: 0.4000 mg | INTRAMUSCULAR | Status: DC | PRN

## 2015-03-02 MED ORDER — PROPOFOL 10 MG/ML IV BOLUS
INTRAVENOUS | Status: AC
  Filled 2015-03-02: qty 20

## 2015-03-02 MED ORDER — SODIUM CHLORIDE 0.9 % IV BOLUS (SEPSIS)
500.0000 mL | Freq: Once | INTRAVENOUS | Status: AC
  Administered 2015-03-02: 500 mL via INTRAVENOUS

## 2015-03-02 MED ORDER — PROMETHAZINE HCL 25 MG/ML IJ SOLN: 6.2500 mg | INTRAMUSCULAR | Status: DC | PRN

## 2015-03-02 MED ORDER — 0.9 % SODIUM CHLORIDE (POUR BTL) OPTIME
TOPICAL | Status: DC | PRN
  Administered 2015-03-02: 3000 mL

## 2015-03-02 MED ORDER — MIDAZOLAM HCL 5 MG/5ML IJ SOLN
INTRAMUSCULAR | Status: DC | PRN
  Administered 2015-03-02: 2 mg via INTRAVENOUS

## 2015-03-02 MED ORDER — CEFAZOLIN SODIUM-DEXTROSE 2-3 GM-% IV SOLR
INTRAVENOUS | Status: AC
  Filled 2015-03-02: qty 50

## 2015-03-02 MED ORDER — MORPHINE SULFATE (PF) 1 MG/ML IV SOLN
INTRAVENOUS | Status: DC
  Administered 2015-03-02: 18 mg via INTRAVENOUS
  Administered 2015-03-02: 1 mg via INTRAVENOUS
  Administered 2015-03-02 (×2): via INTRAVENOUS
  Administered 2015-03-02: 7.5 mg via INTRAVENOUS
  Administered 2015-03-03: 4.5 mg via INTRAVENOUS
  Administered 2015-03-03: 12 mg via INTRAVENOUS
  Administered 2015-03-03: 9 mg via INTRAVENOUS
  Administered 2015-03-03: 12.5 mg via INTRAVENOUS
  Administered 2015-03-03: 7.32 mg via INTRAVENOUS
  Administered 2015-03-04: 3 mg via INTRAVENOUS
  Administered 2015-03-04: 8.05 mg via INTRAVENOUS
  Administered 2015-03-04: 13.5 mg via INTRAVENOUS
  Administered 2015-03-04: 15 mg via INTRAVENOUS
  Administered 2015-03-04: 25 mg via INTRAVENOUS
  Administered 2015-03-05: 18:00:00 via INTRAVENOUS
  Administered 2015-03-05: 6 mg via INTRAVENOUS
  Administered 2015-03-05: 1.5 mg via INTRAVENOUS
  Administered 2015-03-05: 4.5 mg via INTRAVENOUS
  Administered 2015-03-05: 7.5 mg via INTRAVENOUS
  Administered 2015-03-06: 1.5 mg via INTRAVENOUS
  Administered 2015-03-06: 4.5 mg via INTRAVENOUS
  Administered 2015-03-06: 3 mg via INTRAVENOUS
  Administered 2015-03-06: 7.45 mg via INTRAVENOUS
  Administered 2015-03-06: 1.5 mg via INTRAVENOUS
  Administered 2015-03-06: 4.5 mg via INTRAVENOUS
  Administered 2015-03-07: 11:00:00 via INTRAVENOUS
  Administered 2015-03-07: 3 mg via INTRAVENOUS
  Administered 2015-03-07: 6 mg via INTRAVENOUS
  Administered 2015-03-07: 6.45 mg via INTRAVENOUS
  Administered 2015-03-07: 7.5 mg via INTRAVENOUS
  Administered 2015-03-07: 6 mg via INTRAVENOUS
  Administered 2015-03-07: 4.5 mg via INTRAVENOUS
  Administered 2015-03-07: 6 mg via INTRAVENOUS
  Administered 2015-03-08: 15 mg via INTRAVENOUS
  Administered 2015-03-08: 10.45 mg via INTRAVENOUS
  Filled 2015-03-02 (×12): qty 25

## 2015-03-02 MED ORDER — PANTOPRAZOLE SODIUM 40 MG IV SOLR
40.0000 mg | Freq: Every day | INTRAVENOUS | Status: DC
  Administered 2015-03-02 – 2015-03-07 (×5): 40 mg via INTRAVENOUS
  Filled 2015-03-02 (×7): qty 40

## 2015-03-02 MED ORDER — ROCURONIUM BROMIDE 100 MG/10ML IV SOLN
INTRAVENOUS | Status: DC | PRN
  Administered 2015-03-02: 15 mg via INTRAVENOUS
  Administered 2015-03-02: 40 mg via INTRAVENOUS
  Administered 2015-03-02: 20 mg via INTRAVENOUS

## 2015-03-02 MED ORDER — HYDROMORPHONE HCL 1 MG/ML IJ SOLN
INTRAMUSCULAR | Status: AC
  Filled 2015-03-02: qty 1

## 2015-03-02 MED ORDER — HYDROMORPHONE HCL 2 MG/ML IJ SOLN
INTRAMUSCULAR | Status: AC
  Filled 2015-03-02: qty 1

## 2015-03-02 MED ORDER — DIPHENHYDRAMINE HCL 12.5 MG/5ML PO ELIX: 12.5000 mg | ORAL_SOLUTION | Freq: Four times a day (QID) | ORAL | Status: DC | PRN

## 2015-03-02 MED ORDER — DEXAMETHASONE SODIUM PHOSPHATE 10 MG/ML IJ SOLN
INTRAMUSCULAR | Status: DC | PRN
  Administered 2015-03-02: 5 mg via INTRAVENOUS

## 2015-03-02 MED ORDER — LACTATED RINGERS IV SOLN
INTRAVENOUS | Status: DC | PRN
  Administered 2015-03-02 (×3): via INTRAVENOUS

## 2015-03-02 MED ORDER — METOCLOPRAMIDE HCL 5 MG/ML IJ SOLN
INTRAMUSCULAR | Status: DC | PRN
Start: 2015-03-02 — End: 2015-03-02
  Administered 2015-03-02: 10 mg via INTRAVENOUS

## 2015-03-02 MED ORDER — CEFAZOLIN SODIUM 1-5 GM-% IV SOLN
1.0000 g | Freq: Three times a day (TID) | INTRAVENOUS | Status: AC
  Administered 2015-03-02 – 2015-03-03 (×3): 1 g via INTRAVENOUS
  Filled 2015-03-02 (×3): qty 50

## 2015-03-02 MED ORDER — ONDANSETRON HCL 4 MG/2ML IJ SOLN
4.0000 mg | Freq: Four times a day (QID) | INTRAMUSCULAR | Status: DC | PRN
  Administered 2015-03-02: 4 mg via INTRAVENOUS
  Filled 2015-03-02 (×5): qty 2

## 2015-03-02 MED ORDER — CETYLPYRIDINIUM CHLORIDE 0.05 % MT LIQD
7.0000 mL | Freq: Two times a day (BID) | OROMUCOSAL | Status: DC
  Administered 2015-03-03 – 2015-03-08 (×9): 7 mL via OROMUCOSAL

## 2015-03-02 MED ORDER — OXYCODONE HCL 5 MG/5ML PO SOLN
5.0000 mg | Freq: Once | ORAL | Status: DC | PRN
  Filled 2015-03-02: qty 5

## 2015-03-02 MED ORDER — GLYCOPYRROLATE 0.2 MG/ML IJ SOLN
INTRAMUSCULAR | Status: DC | PRN
  Administered 2015-03-02: 0.2 mg via INTRAVENOUS
  Administered 2015-03-02: 0.6 mg via INTRAVENOUS

## 2015-03-02 MED ORDER — KCL-LACTATED RINGERS-D5W 20 MEQ/L IV SOLN
INTRAVENOUS | Status: DC
Start: 2015-03-02 — End: 2015-03-03
  Administered 2015-03-02: 14:00:00 via INTRAVENOUS
  Administered 2015-03-02: 125 mL/h via INTRAVENOUS
  Filled 2015-03-02 (×8): qty 1000

## 2015-03-02 MED ORDER — ALBUTEROL SULFATE HFA 108 (90 BASE) MCG/ACT IN AERS
INHALATION_SPRAY | RESPIRATORY_TRACT | Status: DC | PRN
  Administered 2015-03-02: 5 via RESPIRATORY_TRACT

## 2015-03-02 MED ORDER — PROPOFOL 10 MG/ML IV BOLUS
INTRAVENOUS | Status: DC | PRN
  Administered 2015-03-02: 170 mg via INTRAVENOUS

## 2015-03-02 MED ORDER — ONDANSETRON HCL 4 MG/2ML IJ SOLN
INTRAMUSCULAR | Status: AC
  Filled 2015-03-02: qty 2

## 2015-03-02 MED ORDER — CEFAZOLIN SODIUM-DEXTROSE 2-3 GM-% IV SOLR
2.0000 g | INTRAVENOUS | Status: AC
  Administered 2015-03-02: 2 g via INTRAVENOUS

## 2015-03-02 MED ORDER — FENTANYL CITRATE 0.05 MG/ML IJ SOLN
INTRAMUSCULAR | Status: DC | PRN
  Administered 2015-03-02: 50 ug via INTRAVENOUS
  Administered 2015-03-02: 25 ug via INTRAVENOUS
  Administered 2015-03-02 (×2): 50 ug via INTRAVENOUS
  Administered 2015-03-02: 75 ug via INTRAVENOUS
  Administered 2015-03-02: 100 ug via INTRAVENOUS

## 2015-03-02 MED ORDER — ONDANSETRON HCL 4 MG/2ML IJ SOLN
4.0000 mg | INTRAMUSCULAR | Status: DC | PRN
  Administered 2015-03-02 – 2015-03-06 (×8): 4 mg via INTRAVENOUS
  Filled 2015-03-02 (×4): qty 2

## 2015-03-02 MED ORDER — HYDROMORPHONE HCL 1 MG/ML IJ SOLN
0.2500 mg | INTRAMUSCULAR | Status: DC | PRN
  Administered 2015-03-02 (×2): 0.5 mg via INTRAVENOUS

## 2015-03-02 MED ORDER — ONDANSETRON HCL 4 MG/2ML IJ SOLN
INTRAMUSCULAR | Status: DC | PRN
  Administered 2015-03-02: 4 mg via INTRAVENOUS

## 2015-03-02 MED ORDER — SODIUM CHLORIDE 0.9 % IV BOLUS (SEPSIS)
1000.0000 mL | Freq: Once | INTRAVENOUS | Status: AC
  Administered 2015-03-02: 1000 mL via INTRAVENOUS

## 2015-03-02 MED ORDER — KCL IN DEXTROSE-NACL 20-5-0.45 MEQ/L-%-% IV SOLN
INTRAVENOUS | Status: AC
  Filled 2015-03-02: qty 1000

## 2015-03-02 MED ORDER — NEOSTIGMINE METHYLSULFATE 10 MG/10ML IV SOLN
INTRAVENOUS | Status: DC | PRN
  Administered 2015-03-02: 4 mg via INTRAVENOUS

## 2015-03-02 MED ORDER — MORPHINE SULFATE (PF) 1 MG/ML IV SOLN
INTRAVENOUS | Status: AC
  Filled 2015-03-02: qty 25

## 2015-03-02 SURGICAL SUPPLY — 53 items
BAG URINE DRAINAGE (UROLOGICAL SUPPLIES) IMPLANT
BINDER ABDOMINAL 12 ML 46-62 (SOFTGOODS) IMPLANT
BLADE HEX COATED 2.75 (ELECTRODE) ×4 IMPLANT
CATH FOLEY 3WAY  5CC 16FR (CATHETERS) ×1
CATH FOLEY 3WAY 5CC 16FR (CATHETERS) ×3 IMPLANT
DRAIN CHANNEL RND F F (WOUND CARE) IMPLANT
DRAPE INCISE IOBAN 66X45 STRL (DRAPES) IMPLANT
DRAPE LAPAROSCOPIC ABDOMINAL (DRAPES) ×4 IMPLANT
DRAPE WARM FLUID 44X44 (DRAPE) ×4 IMPLANT
DRSG OPSITE POSTOP 4X10 (GAUZE/BANDAGES/DRESSINGS) ×4 IMPLANT
DRSG PAD ABDOMINAL 8X10 ST (GAUZE/BANDAGES/DRESSINGS) IMPLANT
ELECT BLADE TIP CTD 4 INCH (ELECTRODE) ×4 IMPLANT
ELECT REM PT RETURN 9FT ADLT (ELECTROSURGICAL) ×8
ELECTRODE REM PT RTRN 9FT ADLT (ELECTROSURGICAL) ×6 IMPLANT
EVACUATOR SILICONE 100CC (DRAIN) IMPLANT
GAUZE SPONGE 4X4 12PLY STRL (GAUZE/BANDAGES/DRESSINGS) ×8 IMPLANT
GAUZE SPONGE 4X4 16PLY XRAY LF (GAUZE/BANDAGES/DRESSINGS) ×4 IMPLANT
GLOVE BIOGEL PI IND STRL 7.0 (GLOVE) ×3 IMPLANT
GLOVE BIOGEL PI INDICATOR 7.0 (GLOVE) ×1
GLOVE ECLIPSE 8.0 STRL XLNG CF (GLOVE) ×4 IMPLANT
GLOVE INDICATOR 8.0 STRL GRN (GLOVE) ×8 IMPLANT
GLOVE ORTHO TXT STRL SZ7.5 (GLOVE) ×4 IMPLANT
GOWN STRL REUS W/TWL LRG LVL3 (GOWN DISPOSABLE) ×4 IMPLANT
GOWN STRL REUS W/TWL XL LVL3 (GOWN DISPOSABLE) ×8 IMPLANT
KIT BASIN OR (CUSTOM PROCEDURE TRAY) ×8 IMPLANT
LIGASURE IMPACT 36 18CM CVD LR (INSTRUMENTS) ×4 IMPLANT
NS IRRIG 1000ML POUR BTL (IV SOLUTION) ×8 IMPLANT
PACK GENERAL/GYN (CUSTOM PROCEDURE TRAY) ×4 IMPLANT
SPONGE LAP 18X18 X RAY DECT (DISPOSABLE) ×4 IMPLANT
SPONGE LAP 4X18 X RAY DECT (DISPOSABLE) IMPLANT
STAPLER GUN LINEAR PROX 60 (STAPLE) ×4 IMPLANT
STAPLER PROXIMATE 75MM BLUE (STAPLE) ×4 IMPLANT
STAPLER VISISTAT 35W (STAPLE) ×4 IMPLANT
SUCTION POOLE TIP (SUCTIONS) ×4 IMPLANT
SUT CHROMIC 1 CT1 27 (SUTURE) IMPLANT
SUT CHROMIC 1MO 4 18 CR8 (SUTURE) ×8 IMPLANT
SUT ETHILON 3 0 PS 1 (SUTURE) IMPLANT
SUT NOVA 1 T20/GS 25DT (SUTURE) ×16 IMPLANT
SUT NOVA NAB GS-21 0 18 T12 DT (SUTURE) IMPLANT
SUT PDS AB 1 CTX 36 (SUTURE) IMPLANT
SUT SILK 0 SH 30 (SUTURE) ×4 IMPLANT
SUT SILK 3 0 SH CR/8 (SUTURE) ×8 IMPLANT
SUT VIC AB 0 CT1 18XCR BRD 8 (SUTURE) ×3 IMPLANT
SUT VIC AB 0 CT1 36 (SUTURE) ×12 IMPLANT
SUT VIC AB 0 CT1 8-18 (SUTURE) ×1
SUT VIC AB 2-0 CT1 27 (SUTURE)
SUT VIC AB 2-0 CT1 TAPERPNT 27 (SUTURE) IMPLANT
SUT VIC AB 3-0 CTX 36 (SUTURE) IMPLANT
SUT VIC AB 4-0 SH 27 (SUTURE)
SUT VIC AB 4-0 SH 27XBRD (SUTURE) IMPLANT
TOWEL OR 17X26 10 PK STRL BLUE (TOWEL DISPOSABLE) ×12 IMPLANT
TRAY FOLEY CATH 14FRSI W/METER (CATHETERS) ×4 IMPLANT
TRAY LAP CHOLE (CUSTOM PROCEDURE TRAY) ×4 IMPLANT

## 2015-03-02 NOTE — Anesthesia Postprocedure Evaluation (Signed)
  Anesthesia Post-op Note  Patient: Lisa Flores  Procedure(s) Performed: Procedure(s) with comments: HERNIA REPAIR INCISIONAL (N/A) - exploratory laparotomy,  HYSTERECTOMY TOTAL ABDOMINAL (N/A) BILATERAL SALPINGECTOMY (Bilateral) SMALL BOWEL RESECTION  Patient Location: PACU  Anesthesia Type:General  Level of Consciousness: awake and alert   Airway and Oxygen Therapy: Patient Spontanous Breathing  Post-op Pain: mild  Post-op Assessment: Post-op Vital signs reviewed  Post-op Vital Signs: stable  Last Vitals:  Filed Vitals:   03/02/15 1130  BP: 122/50  Pulse: 67  Temp:   Resp: 13    Complications: No apparent anesthesia complications

## 2015-03-02 NOTE — Interval H&P Note (Signed)
History and Physical Interval Note:  03/02/2015 7:24 AM  Lisa Flores  has presented today for surgery, with the diagnosis of Incisional Hernia  The various methods of treatment have been discussed with the patient and family. After consideration of risks, benefits and other options for treatment, the patient has consented to  Procedure(s): HERNIA REPAIR INCISIONAL (N/A) HYSTERECTOMY TOTAL ABDOMINAL (N/A) BILATERAL SALPINGECTOMY (Bilateral) as a surgical intervention .  The patient's history has been reviewed, patient examined, no change in status, stable for surgery.  I have reviewed the patient's chart and labs.  Questions were answered to the patient's satisfaction.     Brighten Orndoff Lenna Sciara

## 2015-03-02 NOTE — Progress Notes (Signed)
Paged MD Rosenbower regarding patient's blood pressure trending downward, awaiting callback Neta Mends RN 3:39 PM 03-02-2015

## 2015-03-02 NOTE — Interval H&P Note (Signed)
History and Physical Interval Note:  03/02/2015 7:05 AM  Lisa Flores  has presented today for surgery, with the diagnosis of Incisional Hernia  The various methods of treatment have been discussed with the patient and family. After consideration of risks, benefits and other options for treatment, the patient has consented to  Procedure(s): HERNIA REPAIR INCISIONAL (N/A) HYSTERECTOMY TOTAL ABDOMINAL (N/A) BILATERAL SALPINGECTOMY (Bilateral) as a surgical intervention .  The patient's history has been reviewed, patient examined, no change in status, stable for surgery.  I have reviewed the patient's chart and labs.  Questions were answered to the patient's satisfaction.     Krissie Merrick D

## 2015-03-02 NOTE — Progress Notes (Signed)
Low BP when she arrived to floor.  Responded to fluid bolus.  Hemoglobin down to 9 consistent with ABL anemia of surgery.  Will recheck in AM.

## 2015-03-02 NOTE — H&P (Signed)
Lisa Flores is an 54 y.o. female.   Chief Complaint: Here for elective surgery HPI:  She's had 4 previous cesarean sections. She noted a bulge in the lower abdomen. CT scan demonstrated a ventral hernia containing fatty tissue. It also demonstrated a uterine mass. This mass is ill defined. The she's been evaluated by Dr. Willis Flores. She is going to need a hysterectomy. I've been asked to see her regarding repairing the incisional hernia during the same setting. She denies any constipation or difficulty with urination.  Past Medical History  Diagnosis Date  . 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 Heart murmur   . Headache     migraines  . Lyme disease     Past Surgical History  Procedure Laterality Date  . Cesarean section      x 4  . Tonsillectomy and adenoidectomy    . Tumor removal      on back x 2  . Tubal ligation      Family History  Problem Relation Age of Onset  . Cancer Maternal Grandmother    Social History:  reports that she has never smoked. She does not have any smokeless tobacco history on file. She reports that she drinks alcohol. She reports that she does not use illicit drugs.  Allergies: No Known Allergies  Medications Prior to Admission  Medication Sig Dispense Refill  . amLODipine (NORVASC) 10 MG tablet Take 10 mg by mouth daily.    . Cranberry-Vitamin C-Vitamin E (CRANBERRY PLUS VITAMIN C PO) Take 1 tablet by mouth daily.    Marland Kitchen etodolac (LODINE) 400 MG tablet Take 400 mg by mouth 2 (two) times daily.    Marland Kitchen FLUoxetine (PROZAC) 20 MG capsule Take 20 mg by mouth daily.    Marland Kitchen amLODipine (NORVASC) 5 MG tablet Take 1 tablet (5 mg total) by mouth daily. (Patient not taking: Reported on 02/21/2015) 30 tablet 5  . fluconazole (DIFLUCAN) 150 MG tablet Take 1 tablet (150 mg total) by mouth once. Repeat in one week as needed (Patient not taking: Reported on 02/21/2015) 4 tablet 0    No results found for this or any previous visit (from the past 48 hour(s)). No  results found.  Review of Systems  Constitutional: Negative for fever and chills.  Respiratory: Negative for cough.   Gastrointestinal: Negative for nausea, vomiting and diarrhea.    Blood pressure 153/78, pulse 80, temperature 98.1 F (36.7 C), temperature source Oral, resp. rate 18, height 4\' 8"  (1.422 m), weight 95.142 kg (209 lb 12 oz), last menstrual period 03/01/2015, SpO2 100 %. Physical Exam  General: Overweight female in NAD. 4'9" tall, weight = 205.2 lbs. Pleasant and cooperative.  HEENT: [place]/AT  CV: RRR, no murmur, no JVD.  CHEST: Breath sounds equal and clear. Respirations nonlabored.  ABDOMEN: Soft, obese, lower abdominal mass that is not able to be reduced , lower transverse scar  SKIN: No jaundice or suspicious rashes.  NEUROLOGIC: Alert and oriented, answers questions appropriately, normal gait and station.  PSYCHIATRIC: Normal mood, affect , and behavior.   Assessment/Plan Ventral incisional hernia  Plan:  Primary repair of hernia after Dr. Willis Flores performs the hysterectomy.  Lisa Flores J 03/02/2015, 7:08 AM

## 2015-03-02 NOTE — Discharge Instructions (Addendum)
[facility] _______[facility]   HERNIA REPAIR: POST OP INSTRUCTIONS  Always review your discharge instruction sheet given to you by the facility where your surgery was performed. IF YOU HAVE DISABILITY OR FAMILY LEAVE FORMS, YOU MUST BRING THEM TO THE OFFICE FOR PROCESSING.   DO NOT GIVE THEM TO YOUR DOCTOR.  1. A  prescription for pain medication may be given to you upon discharge.  Take your pain medication as prescribed, if needed.  If narcotic pain medicine is not needed, then you may take acetaminophen (Tylenol) or ibuprofen (Advil) as needed. 2. Take your usually prescribed medications unless otherwise directed. 3. If you need a refill on your pain medication, please contact your pharmacy.  They will contact our office to request authorization. Prescriptions will not be filled after 5 pm or on week-ends. 4. You should follow a light diet the first 24 hours after arrival home, such as soup and crackers, etc.  Be sure to include lots of fluids daily.  Resume your normal diet the day after surgery. 5. Most patients will experience some swelling and bruising around the umbilicus or in the groin and scrotum.  Ice packs and reclining will help.  Swelling and bruising can take several days to resolve.  6. It is common to experience some constipation if taking pain medication after surgery.  Increasing fluid intake and taking a stool softener (such as Colace) will usually help or prevent this problem from occurring.  A mild laxative (Milk of Magnesia or Miralax) should be taken according to package directions if there are no bowel movements after 48 hours. 7.   Any sutures or staples will be removed at the office during your follow-up visit. 8. ACTIVITIES:  You may resume regular (light) daily activities beginning the next day--such as daily self-care, walking, climbing stairs--gradually increasing activities as tolerated.  You may have sexual intercourse when it is comfortable.  Refrain from  any heavy lifting or straining-nothing over 10 pounds for 8 weeks. a. You may drive when you are no longer taking prescription pain medication, you can comfortably wear a seatbelt, and you can safely maneuver your car and apply brakes. b. RETURN TO WORK:  __________________________________________________________ 9. You should see your doctor in the office for a follow-up appointment approximately 2-3 weeks after your surgery.  Make sure that you call for this appointment within a day or two after you arrive home to insure a convenient appointment time. 10. OTHER INSTRUCTIONS:  __________________________________________________________________________________________________________________________________________________________________________________________  WHEN TO CALL YOUR DOCTOR: 1. Fever over 101.0 2. Inability to urinate 3. Nausea and/or vomiting 4. Extreme swelling or bruising 5. Continued bleeding from incision. 6. Increased pain, redness, or drainage from the incision  The clinic staff is available to answer your questions during regular business hours.  Please dont hesitate to call and ask to speak to one of the nurses for clinical concerns.  If you have a medical emergency, go to the nearest emergency room or call 911.  A surgeon from [facility] is always on call at the hospital   [address] [place], [place], [place]  [address] ?  [address], [place], [place]   [address] [phone] ? [phone] ? FAX [phone] Web site: [email or web address]

## 2015-03-02 NOTE — Transfer of Care (Signed)
Immediate Anesthesia Transfer of Care Note  Patient: Lisa Flores  Procedure(s) Performed: Procedure(s) with comments: [place] (N/A) - exploratory laparotomy,  HYSTERECTOMY TOTAL ABDOMINAL (N/A) BILATERAL SALPINGECTOMY (Bilateral) SMALL BOWEL RESECTION  Patient Location: PACU  Anesthesia Type:General  Level of Consciousness: awake, alert , oriented and patient cooperative  Airway & Oxygen Therapy: Patient Spontanous Breathing and Patient connected to face mask oxygen  Post-op Assessment: Report given to RN, Post -op Vital signs reviewed and stable and Patient moving all extremities  Post vital signs: Reviewed and stable  Last Vitals:  Filed Vitals:   03/02/15 0541  BP: 153/78  Pulse: 80  Temp: 36.7 C  Resp: 18    Complications: No apparent anesthesia complications

## 2015-03-02 NOTE — Op Note (Addendum)
Preoperative diagnosis: Menorrhagia and cervical mass, abdominal wall hernia Postoperative diagnosis: Same Procedure: Total abdominal hysterectomy and bilateral salpingectomy, repair of abdominal wall hernia by Dr. Zella Richer Surgeon: Cheri Fowler M.D. Assistant: Jackolyn Confer M.D., Gwenith Spitz, PA-S EBL: 450 cc Findings: Uterus was essentially normal size with normal tubes and ovaries with evidence of prior tubal ligation. She had a large mass arising from the anterior cervix that ended up being essentially the entire cervix. Complications: Small bowel initially injury from the retractor with subsequent small bowel resection  Procedure detail:  I assisted Dr. Zella Richer with entering the abdominal cavity and mobilizing the abdominal hernia. A Balfour retractor was placed and bowels packed out of the pelvis. Uterine cornu were then grasped with long Kelly clamps. Distal fallopian tubes were removed with electrocautery as they were separated from previous tubal ligation. Round ligaments were then taken down with electrocautery and a window was made in avascular portion around the utero-ovarian ligament. These pedicles were then clamped transected and ligated with 0 Vicryl. The broad ligaments were then taken down with electrocautery. Anterior peritoneum was mobilized and the bladder was pushed inferiorly. At this point I noticed the large anterior cervical mass. I was able to mobilize the bladder from this mass bluntly and sharply. Uterine arteries and cardinal ligaments were clamped, transected and ligated with 0 Vicryl. Then using careful dissection we mobilized around this large anterior cervical mass, taking bites with Zeppelin clamps and ligating the pedicles and also using electrocautery where possible. Eventually I was able to pull this cervical mass anterior using towel clips and entered the vagina. The entire cervix and uterus were then removed. The vagina was closed with running locking 0  Vicryl with adequate closure adequate hemostasis. All pedicles were inspected and found to be hemostatic. Pelvis was irrigated and bleeding controlled with cautery. All packs removed from the abdomen and I then assisted Dr. Zella Richer with closure of the abdominal wall. The patient tolerated her hysterectomy well.

## 2015-03-02 NOTE — Op Note (Signed)
Operative Note  Lisa Flores female 54 y.o. 03/02/2015  PREOPERATIVE DX:  Ventral incisional hernia  POSTOPERATIVE DX:  Same with ischemic small bowel  PROCEDURE:   Exploratory laparotomy, small bowel resection, primary repair of ventral incisional hernia         Surgeon: Odis Hollingshead   Assistants: Cheri Fowler, MD; Gwenith Spitz, PA-S  Anesthesia: General endotracheal anesthesia  Indications:   This is a 54 year old female with a large cervical mass and a ventral incisional hernia. She presents now for total abdominal hysterectomy and primary repair of the ventral incisional hernia.    Procedure Detail:  She is brought to the operating room placed supine on the operating table and general anesthetic was administered. A Foley catheter was inserted. The abdominal wall was widely sterilely prepped and draped. A lower midline incision was made dividing the skin and subcutaneous tissue until the fascia was reached. She had a significant distance between the skin and  Fascia. Beginning superiorly I divided the fascia and then came down to where the hernia was present. There was omentum up in the hernia. I mobilized the omentum and divided the fascia and dissected some of the sac. This allowed me to reduce the omentum back into the abdominal cavity. I then extended the fascial incision down to the pubis.  At this time, Dr. Willis Modena proceeded with a abdominal hysterectomy and bilateral salpingectomy.  After completion of Dr. Paulene Floor procedure, the small bowel and sigmoid colon were inspected. There is an area of ischemic small bowel. This was caused by a mesenteric defect which was caused by the self retaining retractor. Part of the small bowel appeared slightly ischemic. I decided to perform a small bowel resection. I divided the mesentery close to the small bowel to like to viable areas. I then placed the small bowel in side-to-side fashion and performed a side-to-side stapled  anastomosis using the linear cutting stapler. The staple lines were hemostatic. I then closed the common defect with a linear noncutting stapler and completed the resection. The small bowel was handed off the field.  The anastomosis was patent, viable, and under no tension. A crotch stitch of 3-0 silk was placed. The mesenteric defect was closed interrupted 3-0 silk.  The abdominal cavity was then copiously irrigated and inspected again. There is no evidence of organ injury or bleeding. The fascia was then primarily closed with interrupted #1 Novafil sutures. Subcutaneous tissues irrigated and skin was closed with staples. A sterile dressing was applied.  She tolerated the procedure well and was taken recovery room in satisfactory condition.  Estimated Blood Loss:  450 cc         Drains: none         Specimens: Small bowel        Complications:  Ischemic segment of small bowel from self-retaining retractor         Disposition: PACU - hemodynamically stable.         Condition: stable

## 2015-03-03 ENCOUNTER — Encounter (HOSPITAL_COMMUNITY): Payer: Self-pay | Admitting: General Surgery

## 2015-03-03 LAB — BASIC METABOLIC PANEL
ANION GAP: 13 (ref 5–15)
ANION GAP: 6 (ref 5–15)
BUN: 29 mg/dL — ABNORMAL HIGH (ref 6–23)
BUN: 30 mg/dL — AB (ref 6–23)
CALCIUM: 7.9 mg/dL — AB (ref 8.4–10.5)
CHLORIDE: 107 mmol/L (ref 96–112)
CO2: 19 mmol/L (ref 19–32)
CO2: 22 mmol/L (ref 19–32)
CREATININE: 1.84 mg/dL — AB (ref 0.50–1.10)
Calcium: 7.5 mg/dL — ABNORMAL LOW (ref 8.4–10.5)
Chloride: 107 mmol/L (ref 96–112)
Creatinine, Ser: 1.49 mg/dL — ABNORMAL HIGH (ref 0.50–1.10)
GFR calc Af Amer: 36 mL/min — ABNORMAL LOW (ref >90)
GFR calc non Af Amer: 31 mL/min — ABNORMAL LOW (ref >90)
GFR, EST AFRICAN AMERICAN: 46 mL/min — AB (ref >90)
GFR, EST NON AFRICAN AMERICAN: 40 mL/min — AB (ref >90)
Glucose, Bld: 181 mg/dL — ABNORMAL HIGH (ref 70–99)
Glucose, Bld: 135 mg/dL — ABNORMAL HIGH (ref 70–99)
Potassium: 5 mmol/L (ref 3.5–5.1)
Potassium: 4.7 mmol/L (ref 3.5–5.1)
SODIUM: 139 mmol/L (ref 135–145)
Sodium: 135 mmol/L (ref 135–145)

## 2015-03-03 LAB — CBC
HCT: 20.7 % — ABNORMAL LOW (ref 36.0–46.0)
HEMATOCRIT: 24 % — AB (ref 36.0–46.0)
Hemoglobin: 7.6 g/dL — ABNORMAL LOW (ref 12.0–15.0)
Hemoglobin: 6.7 g/dL — CL (ref 12.0–15.0)
MCH: 28.7 pg (ref 26.0–34.0)
MCH: 29 pg (ref 26.0–34.0)
MCHC: 31.7 g/dL (ref 30.0–36.0)
MCHC: 32.4 g/dL (ref 30.0–36.0)
MCV: 90.6 fL (ref 78.0–100.0)
MCV: 89.6 fL (ref 78.0–100.0)
Platelets: 321 10*3/uL (ref 150–400)
Platelets: 248 10*3/uL (ref 150–400)
RBC: 2.65 MIL/uL — ABNORMAL LOW (ref 3.87–5.11)
RBC: 2.31 MIL/uL — ABNORMAL LOW (ref 3.87–5.11)
RDW: 14.3 % (ref 11.5–15.5)
RDW: 14.1 % (ref 11.5–15.5)
WBC: 21.6 10*3/uL — AB (ref 4.0–10.5)
WBC: 19.1 10*3/uL — AB (ref 4.0–10.5)

## 2015-03-03 LAB — PREPARE RBC (CROSSMATCH)

## 2015-03-03 MED ORDER — SODIUM CHLORIDE 0.9 % IV SOLN
Freq: Once | INTRAVENOUS | Status: AC
  Administered 2015-03-03: 21:00:00 via INTRAVENOUS

## 2015-03-03 MED ORDER — PROMETHAZINE HCL 25 MG/ML IJ SOLN
6.2500 mg | INTRAMUSCULAR | Status: DC | PRN
  Administered 2015-03-05 – 2015-03-06 (×4): 6.25 mg via INTRAVENOUS
  Filled 2015-03-03 (×4): qty 1

## 2015-03-03 MED ORDER — SODIUM CHLORIDE 0.9 % IV BOLUS (SEPSIS)
500.0000 mL | Freq: Once | INTRAVENOUS | Status: AC
  Administered 2015-03-03: 500 mL via INTRAVENOUS

## 2015-03-03 MED ORDER — DEXTROSE-NACL 5-0.9 % IV SOLN
INTRAVENOUS | Status: DC
  Administered 2015-03-03: 09:00:00 via INTRAVENOUS
  Administered 2015-03-04: 100 mL/h via INTRAVENOUS
  Administered 2015-03-05 – 2015-03-07 (×4): via INTRAVENOUS

## 2015-03-03 MED ORDER — LIP MEDEX EX OINT
TOPICAL_OINTMENT | CUTANEOUS | Status: AC
  Administered 2015-03-03: 11:00:00
  Filled 2015-03-03: qty 7

## 2015-03-03 NOTE — Progress Notes (Signed)
1 Day Post-Op Procedure(s) (LRB): HERNIA REPAIR INCISIONAL (N/A) HYSTERECTOMY TOTAL ABDOMINAL (N/A) BILATERAL SALPINGECTOMY (Bilateral) SMALL BOWEL RESECTION  Subjective: Patient reports incisional pain-but controlled with PCA, no significant vaginal bleeding    Objective: I have reviewed patient's vital signs, intake and output and labs.  General: alert Vaginal Bleeding: minimal  Assessment: s/p Procedure(s) with comments: HERNIA REPAIR INCISIONAL (N/A) - exploratory laparotomy,  HYSTERECTOMY TOTAL ABDOMINAL (N/A) BILATERAL SALPINGECTOMY (Bilateral) SMALL BOWEL RESECTION: stable and anemia, Cre up, I agree with Dr. Zella Richer that I do not suspect ureteral injury   Plan: Encourage ambulation Continue foley due to urinary output monitoring, recheck labs later this am.  I will continue to follow her daily.  LOS: 1 day    Emika Tiano D 03/03/2015, 9:34 AM

## 2015-03-03 NOTE — Progress Notes (Signed)
Patient lost IV access before I was able to start PRBC. She was stuck 9 times with only 1 successful IV start. Patient very upset and in severe pain to the point she was crying. She stated she can't go without the pain button she is having too much pain. Husband refused to let her be stuck again. She will need 2 sites in order to get pain meds and PRBC's. Notified MD on call obtained order for PICC line.

## 2015-03-03 NOTE — Care Management Note (Signed)
    Page 1 of 1   03/03/2015     10:15:54 AM CARE MANAGEMENT NOTE 03/03/2015  Patient:  Lisa Flores, Lisa Flores   Account Number:  [id number]  Date Initiated:  03/03/2015  Documentation initiated by:  Sunday Spillers  Subjective/Objective Assessment:   54 yo female admitted s/p expl lap, TAH, hernia repair, SBR. PTA lived at home with spouse.     Action/Plan:   Home when stable   Anticipated DC Date:  03/06/2015   Anticipated DC Plan:  [place]  CM consult      Choice offered to / List presented to:             Status of service:  Completed, signed off Medicare Important Message given?   (If response is "NO", the following Medicare IM given date fields will be blank) Date Medicare IM given:   Medicare IM given by:   Date Additional Medicare IM given:   Additional Medicare IM given by:    Discharge Disposition:  HOME/SELF CARE  Per UR Regulation:  Reviewed for med. necessity/level of care/duration of stay  If discussed at [place] of Stay Meetings, dates discussed:    Comments:

## 2015-03-03 NOTE — Progress Notes (Signed)
Hemoglobin down to 6.7.  Some dizziness when standing.  Vitals signs remain within normal limits.  Will transfuse PRBCs for symptomatic anemia.

## 2015-03-03 NOTE — Progress Notes (Signed)
1 Day Post-Op  Subjective: Had some nausea and vomiting last night after getting out of bed.  Still with some nausea this AM.  Pain well controlled.  Objective: Vital signs in last 24 hours: Temp:  [97.8 F (36.6 C)-99.4 F (37.4 C)] 98.8 F (37.1 C) (03/18 0554) Pulse Rate:  [65-87] 78 (03/18 0554) Resp:  [10-25] 16 (03/18 0839) BP: (73-154)/(35-79) 134/72 mmHg (03/18 0554) SpO2:  [94 %-100 %] 100 % (03/18 0839)    Intake/Output from previous day: 03/17 0701 - 03/18 0700 In: 5981.3 [I.V.:4964.6; IV Piggyback:1016.7] Out: 1088 [Urine:635; Emesis/NG output:3; Blood:450] Intake/Output this shift:    PE: General- In NAD Abdomen-soft, quiet, dried bloody drainage on dressing.  Lab Results:   Recent Labs  03/02/15 1635 03/03/15 0535  WBC 25.8* 21.6*  HGB 9.0* 7.6*  HCT 27.5* 24.0*  PLT 367 321   BMET  Recent Labs  03/03/15 0535  NA 139  K 5.0  CL 107  CO2 19  GLUCOSE 181*  BUN 29*  CREATININE 1.84*  CALCIUM 7.9*   PT/INR No results for input(s): LABPROT, INR in the last 72 hours. Comprehensive Metabolic Panel:    Component Value Date/Time   NA 139 03/03/2015 0535   NA 137 02/24/2015 1030   K 5.0 03/03/2015 0535   K 4.2 02/24/2015 1030   CL 107 03/03/2015 0535   CL 109 02/24/2015 1030   CO2 19 03/03/2015 0535   CO2 22 02/24/2015 1030   BUN 29* 03/03/2015 0535   BUN 22 02/24/2015 1030   CREATININE 1.84* 03/03/2015 0535   CREATININE 0.57 02/24/2015 1030   CREATININE 0.51 08/26/2014 1042   CREATININE 0.77 06/13/2014 1708   GLUCOSE 181* 03/03/2015 0535   GLUCOSE 106* 02/24/2015 1030   CALCIUM 7.9* 03/03/2015 0535   CALCIUM 9.3 02/24/2015 1030   AST 17 02/24/2015 1030   AST 16 08/26/2014 1042   ALT 14 02/24/2015 1030   ALT 26 08/26/2014 1042   ALKPHOS 57 02/24/2015 1030   ALKPHOS 48 08/26/2014 1042   BILITOT 0.9 02/24/2015 1030   BILITOT 0.9 08/26/2014 1042   PROT 7.2 02/24/2015 1030   PROT 6.8 08/26/2014 1042   ALBUMIN 4.0 02/24/2015 1030   ALBUMIN 4.3 08/26/2014 1042     Studies/Results: No results found.  Anti-infectives: Anti-infectives    Start     Dose/Rate Route Frequency Ordered Stop   03/02/15 1600  ceFAZolin (ANCEF) IVPB 1 g/50 mL premix     1 g 100 mL/hr over 30 Minutes Intravenous 3 times per day 03/02/15 1345 03/03/15 0531   03/02/15 0547  ceFAZolin (ANCEF) IVPB 2 g/50 mL premix     2 g 100 mL/hr over 30 Minutes Intravenous On call to O.R. 03/02/15 0547 03/02/15 0718      Assessment Active Problems:  S/p TAH and bilateral salpingectomy, SB resection, primary ventral hernia repair-VS wnl    ABL anemia-hemoglobin down to 7.6 this AM, no hemodynamic instability, hypotension responded well to fluid bolus   Acute kidney injury-most likely due to hypoperfusion rather than ureteral injury    LOS: 1 day   Plan: Recheck lab later this AM and transfuse if hemoglobin less than 7 (discussed with patient and husband).  500 cc fluid bolus.  Bedrest.  NPO except ice chips.  Hold Heparin for now.   Laymon Stockert J 03/03/2015

## 2015-03-04 LAB — BASIC METABOLIC PANEL
Anion gap: 5 (ref 5–15)
BUN: 19 mg/dL (ref 6–23)
CALCIUM: 7.9 mg/dL — AB (ref 8.4–10.5)
CHLORIDE: 107 mmol/L (ref 96–112)
CO2: 26 mmol/L (ref 19–32)
CREATININE: 0.71 mg/dL (ref 0.50–1.10)
GFR calc non Af Amer: >90 mL/min (ref >90)
Glucose, Bld: 121 mg/dL — ABNORMAL HIGH (ref 70–99)
POTASSIUM: 4.3 mmol/L (ref 3.5–5.1)
Sodium: 138 mmol/L (ref 135–145)

## 2015-03-04 LAB — HEMOGLOBIN AND HEMATOCRIT, BLOOD
HCT: 26.9 % — ABNORMAL LOW (ref 36.0–46.0)
Hemoglobin: 8.9 g/dL — ABNORMAL LOW (ref 12.0–15.0)

## 2015-03-04 LAB — CBC
HEMATOCRIT: 26.2 % — AB (ref 36.0–46.0)
Hemoglobin: 8.7 g/dL — ABNORMAL LOW (ref 12.0–15.0)
MCH: 29.1 pg (ref 26.0–34.0)
MCHC: 33.2 g/dL (ref 30.0–36.0)
MCV: 87.6 fL (ref 78.0–100.0)
PLATELETS: 210 10*3/uL (ref 150–400)
RBC: 2.99 MIL/uL — ABNORMAL LOW (ref 3.87–5.11)
RDW: 14.8 % (ref 11.5–15.5)
WBC: 14.6 10*3/uL — AB (ref 4.0–10.5)

## 2015-03-04 MED ORDER — ALUM & MAG HYDROXIDE-SIMETH 200-200-20 MG/5ML PO SUSP
15.0000 mL | ORAL | Status: DC | PRN
  Administered 2015-03-04 (×3): 15 mL via ORAL
  Filled 2015-03-04 (×3): qty 30

## 2015-03-04 MED ORDER — SODIUM CHLORIDE 0.9 % IJ SOLN
10.0000 mL | INTRAMUSCULAR | Status: DC | PRN
  Administered 2015-03-06 – 2015-03-09 (×4): 10 mL
  Filled 2015-03-04 (×4): qty 40

## 2015-03-04 NOTE — Progress Notes (Signed)
Peripherally Inserted Central Catheter/Midline Placement  The IV Nurse has discussed with the patient and/or persons authorized to consent for the patient, the purpose of this procedure and the potential benefits and risks involved with this procedure.  The benefits include less needle sticks, lab draws from the catheter and patient may be discharged home with the catheter.  Risks include, but not limited to, infection, bleeding, blood clot (thrombus formation), and puncture of an artery; nerve damage and irregular heat beat.  Alternatives to this procedure were also discussed.  PICC/Midline Placement Documentation  PICC / Midline Double Lumen 03/04/15 PICC Right Cephalic 36 cm 0 cm (Active)  Indication for Insertion or Continuance of Line Limited venous access - need for IV therapy >5 days (PICC only);Poor Vasculature-patient has had multiple peripheral attempts or PIVs lasting less than 24 hours 03/04/2015  8:00 AM  Exposed Catheter (cm) 0 cm 03/04/2015  8:00 AM  Site Assessment Clean;Dry;Intact 03/04/2015  8:00 AM  Lumen #1 Status Flushed;Saline locked;Blood return noted 03/04/2015  8:00 AM  Lumen #2 Status Flushed;Saline locked;Blood return noted 03/04/2015  8:00 AM  Dressing Type Transparent 03/04/2015  8:00 AM  Dressing Status Clean;Dry;Intact;Antimicrobial disc in place 03/04/2015  8:00 AM  Line Care Connections checked and tightened 03/04/2015  8:00 AM  Line Adjustment (NICU/IV Team Only) No 03/04/2015  8:00 AM  Dressing Intervention New dressing 03/04/2015  8:00 AM  Dressing Change Due 03/11/15 03/04/2015  8:00 AM       Rolena Infante 03/04/2015, 8:38 AM

## 2015-03-04 NOTE — Progress Notes (Signed)
2 Days Post-Op Procedure(s) (LRB): HERNIA REPAIR INCISIONAL (N/A) HYSTERECTOMY TOTAL ABDOMINAL (N/A) BILATERAL SALPINGECTOMY (Bilateral) SMALL BOWEL RESECTION  Subjective: Patient reports nausea and incisional pain, as well as heartburn.  Did ok without PCA while receiving blood  Objective: I have reviewed patient's vital signs, intake and output, labs and pathology.  General: alert  Assessment: s/p Procedure(s) with comments: HERNIA REPAIR INCISIONAL (N/A) - exploratory laparotomy,  HYSTERECTOMY TOTAL ABDOMINAL (N/A) BILATERAL SALPINGECTOMY (Bilateral) SMALL BOWEL RESECTION: Has had difficult IV access, getting PICC line.  Received 2uPRBC-finished early this am, labs not drawn yet today.  She appears hemodynamically stable, adequate UOP.  Plan: Will encourage ambulation when she is not lightheaded.  Would consider CT scan and possible interventional radiology for vessel embolization if Hgb continues to drop. She is already on Protonix nightly, will let surgeon address PO antacids for heartburn.  Keep foley in for now since not ambulating.  LOS: 2 days    Gustavo Meditz D 03/04/2015, 8:17 AM

## 2015-03-04 NOTE — Progress Notes (Signed)
Patient ID: Lisa Flores, female   DOB: [birth year], 54 y.o.   MRN: [id number] 2 Days Post-Op  Subjective: Complaining of heartburn and some fluid regurgitating up into her throat. No nausea or vomiting. Abdominal pain is somewhat better.  Objective: Vital signs in last 24 hours: Temp:  [97.6 F (36.4 C)-99.1 F (37.3 C)] 99.1 F (37.3 C) (03/19 0620) Pulse Rate:  [68-85] 85 (03/19 0620) Resp:  [13-20] 15 (03/19 0620) BP: (116-177)/(55-70) 160/69 mmHg (03/19 0620) SpO2:  [95 %-100 %] 98 % (03/19 0620) Last BM Date: 03/02/15  Intake/Output from previous day: 03/18 0701 - 03/19 0700 In: 3173.1 [I.V.:1529.1; Blood:994; IV Piggyback:650] Out: 8527 [Urine:1625] Intake/Output this shift:    General appearance: alert, cooperative, no distress and moderately obese GI: soft with minimal tenderness and no guarding Incision/Wound: Binder in place, clean and dry  Lab Results:   Recent Labs  03/03/15 0535 03/03/15 1225  WBC 21.6* 19.1*  HGB 7.6* 6.7*  HCT 24.0* 20.7*  PLT 321 248   BMET  Recent Labs  03/03/15 0535 03/03/15 1225  NA 139 135  K 5.0 4.7  CL 107 107  CO2 19 22  GLUCOSE 181* 135*  BUN 29* 30*  CREATININE 1.84* 1.49*  CALCIUM 7.9* 7.5*     Studies/Results: No results found.  Anti-infectives: Anti-infectives    Start     Dose/Rate Route Frequency Ordered Stop   03/02/15 1600  ceFAZolin (ANCEF) IVPB 1 g/50 mL premix     1 g 100 mL/hr over 30 Minutes Intravenous 3 times per day 03/02/15 1345 03/03/15 0531   03/02/15 0547  ceFAZolin (ANCEF) IVPB 2 g/50 mL premix     2 g 100 mL/hr over 30 Minutes Intravenous On call to O.R. 03/02/15 0547 03/02/15 0718      Assessment/Plan: s/p Procedure(s): HERNIA REPAIR INCISIONAL HYSTERECTOMY TOTAL ABDOMINAL BILATERAL SALPINGECTOMY SMALL BOWEL RESECTION Postoperative acute blood loss anemia Patient is stable. PICC line just placed for IV access. Had 2 unit transfusion last night. Check CBC this morning and  follow H&H Probable ileus. Continue nothing by mouth for now.   LOS: 2 days    [place] Doig T 03/04/2015

## 2015-03-05 LAB — TYPE AND SCREEN
ABO/RH(D): A POS
Antibody Screen: NEGATIVE
UNIT DIVISION: 0
Unit division: 0

## 2015-03-05 LAB — CBC
HCT: 26.8 % — ABNORMAL LOW (ref 36.0–46.0)
HEMOGLOBIN: 8.6 g/dL — AB (ref 12.0–15.0)
MCH: 28.7 pg (ref 26.0–34.0)
MCHC: 32.1 g/dL (ref 30.0–36.0)
MCV: 89.3 fL (ref 78.0–100.0)
PLATELETS: 203 10*3/uL (ref 150–400)
RBC: 3 MIL/uL — ABNORMAL LOW (ref 3.87–5.11)
RDW: 15.2 % (ref 11.5–15.5)
WBC: 12.8 10*3/uL — ABNORMAL HIGH (ref 4.0–10.5)

## 2015-03-05 NOTE — Progress Notes (Signed)
Patient ID: Lisa Flores, female   DOB: 1961-04-23, 54 y.o.   MRN: [id number] 3 Days Post-Op  Subjective: Drowsy. Denies abdominal pain. Has had some continued nausea but feels that it is better this morning and she is no longer having any regurgitation of fluids. Still some belching a little heartburn. She got out of bed to a chair yesterday.  Objective: Vital signs in last 24 hours: Temp:  [97.5 F (36.4 C)-98.6 F (37 C)] 98.6 F (37 C) (03/20 0622) Pulse Rate:  [72-79] 74 (03/20 0622) Resp:  [13-20] 18 (03/20 0622) BP: (136-177)/(59-86) 143/86 mmHg (03/20 0622) SpO2:  [94 %-98 %] 98 % (03/20 0622) Last BM Date: 03/02/15  Intake/Output from previous day: 03/19 0701 - 03/20 0700 In: -  Out: 2000 [Urine:2000] Intake/Output this shift: Total I/O In: -  Out: 300 [Urine:300]  General appearance: cooperative, fatigued, no distress and morbidly obese Resp: clear to auscultation bilaterally GI: normal findings: soft, non-tender Incision/Wound: no erythema or unusual drainage  Lab Results:   Recent Labs  03/04/15 1005 03/04/15 2210 03/05/15 0518  WBC 14.6*  --  12.8*  HGB 8.7* 8.9* 8.6*  HCT 26.2* 26.9* 26.8*  PLT 210  --  203   BMET  Recent Labs  03/03/15 1225 03/04/15 1005  NA 135 138  K 4.7 4.3  CL 107 107  CO2 22 26  GLUCOSE 135* 121*  BUN 30* 19  CREATININE 1.49* 0.71  CALCIUM 7.5* 7.9*     Studies/Results: No results found.  Anti-infectives: Anti-infectives    Start     Dose/Rate Route Frequency Ordered Stop   03/02/15 1600  ceFAZolin (ANCEF) IVPB 1 g/50 mL premix     1 g 100 mL/hr over 30 Minutes Intravenous 3 times per day 03/02/15 1345 03/03/15 0531   03/02/15 0547  ceFAZolin (ANCEF) IVPB 2 g/50 mL premix     2 g 100 mL/hr over 30 Minutes Intravenous On call to O.R. 03/02/15 0547 03/02/15 0718      Assessment/Plan: s/p Procedure(s): HERNIA REPAIR INCISIONAL HYSTERECTOMY TOTAL ABDOMINAL BILATERAL SALPINGECTOMY SMALL BOWEL  RESECTION Stable. Hemoglobin remained stable posttransfusion. Still likely some degree of ileus. Continue nothing by mouth for now. Ambulation encouraged.   LOS: 3 days    Cereniti Curb T 03/05/2015

## 2015-03-05 NOTE — Plan of Care (Signed)
Problem: Phase II Progression Outcomes Goal: Progress activity as tolerated unless otherwise ordered Outcome: Progressing Pt requires much encouragement to ambulate.

## 2015-03-05 NOTE — Progress Notes (Signed)
3 Days Post-Op Procedure(s) (LRB): HERNIA REPAIR INCISIONAL (N/A) HYSTERECTOMY TOTAL ABDOMINAL (N/A) BILATERAL SALPINGECTOMY (Bilateral) SMALL BOWEL RESECTION  Subjective: Patient reports incisional pain and no problems voiding.  Feeling better today  Objective: I have reviewed patient's vital signs, intake and output and labs.  General: alert GI: soft, incision appears intact Vaginal Bleeding: minimal  Assessment: s/p Procedure(s) with comments: HERNIA REPAIR INCISIONAL (N/A) - exploratory laparotomy,  HYSTERECTOMY TOTAL ABDOMINAL (N/A) BILATERAL SALPINGECTOMY (Bilateral) SMALL BOWEL RESECTION: stable, Hgb stable after transfusion, Cre improved  Plan: Encourage ambulation  LOS: 3 days    Lisa Flores D 03/05/2015, 9:39 AM

## 2015-03-05 NOTE — Progress Notes (Signed)
Morphine 3 mg wasted in sink. Witness by Penni Bombard RN.

## 2015-03-06 MED ORDER — FLUOXETINE HCL 20 MG PO CAPS
20.0000 mg | ORAL_CAPSULE | Freq: Every day | ORAL | Status: DC
  Administered 2015-03-06 – 2015-03-09 (×4): 20 mg via ORAL
  Filled 2015-03-06 (×4): qty 1

## 2015-03-06 MED ORDER — HYDRALAZINE HCL 20 MG/ML IJ SOLN: 20.0000 mg | INTRAMUSCULAR | Status: DC | PRN

## 2015-03-06 MED ORDER — KETOROLAC TROMETHAMINE 30 MG/ML IJ SOLN
30.0000 mg | Freq: Four times a day (QID) | INTRAMUSCULAR | Status: AC
  Administered 2015-03-06 – 2015-03-07 (×6): 30 mg via INTRAVENOUS
  Filled 2015-03-06 (×8): qty 1

## 2015-03-06 MED ORDER — AMLODIPINE BESYLATE 10 MG PO TABS
10.0000 mg | ORAL_TABLET | Freq: Every day | ORAL | Status: DC
Start: 2015-03-06 — End: 2015-03-09
  Administered 2015-03-06 – 2015-03-09 (×4): 10 mg via ORAL
  Filled 2015-03-06 (×4): qty 1

## 2015-03-06 NOTE — Progress Notes (Signed)
4 Days Post-Op  Subjective: Still with nausea and incisional pain.  One episode of flatus this AM.  Objective: Vital signs in last 24 hours: Temp:  [97.9 F (36.6 C)-98.8 F (37.1 C)] 97.9 F (36.6 C) (03/21 0550) Pulse Rate:  [66-74] 71 (03/21 0550) Resp:  [12-20] 17 (03/21 0737) BP: (157-193)/(68-99) 166/99 mmHg (03/21 0550) SpO2:  [93 %-100 %] 97 % (03/21 0737) Last BM Date: 03/02/15  Intake/Output from previous day: 03/20 0701 - 03/21 0700 In: 2400 [I.V.:2400] Out: 1800 [Urine:1800] Intake/Output this shift:    PE: General- In NAD Abdomen-soft, hypoactive bowel sounds, incision is clean and intact  Lab Results:   Recent Labs  03/04/15 1005 03/04/15 2210 03/05/15 0518  WBC 14.6*  --  12.8*  HGB 8.7* 8.9* 8.6*  HCT 26.2* 26.9* 26.8*  PLT 210  --  203   BMET  Recent Labs  03/03/15 1225 03/04/15 1005  NA 135 138  K 4.7 4.3  CL 107 107  CO2 22 26  GLUCOSE 135* 121*  BUN 30* 19  CREATININE 1.49* 0.71  CALCIUM 7.5* 7.9*   PT/INR No results for input(s): LABPROT, INR in the last 72 hours. Comprehensive Metabolic Panel:    Component Value Date/Time   NA 138 03/04/2015 1005   NA 135 03/03/2015 1225   K 4.3 03/04/2015 1005   K 4.7 03/03/2015 1225   CL 107 03/04/2015 1005   CL 107 03/03/2015 1225   CO2 26 03/04/2015 1005   CO2 22 03/03/2015 1225   BUN 19 03/04/2015 1005   BUN 30* 03/03/2015 1225   CREATININE 0.71 03/04/2015 1005   CREATININE 1.49* 03/03/2015 1225   CREATININE 0.51 08/26/2014 1042   CREATININE 0.77 06/13/2014 1708   GLUCOSE 121* 03/04/2015 1005   GLUCOSE 135* 03/03/2015 1225   CALCIUM 7.9* 03/04/2015 1005   CALCIUM 7.5* 03/03/2015 1225   AST 17 02/24/2015 1030   AST 16 08/26/2014 1042   ALT 14 02/24/2015 1030   ALT 26 08/26/2014 1042   ALKPHOS 57 02/24/2015 1030   ALKPHOS 48 08/26/2014 1042   BILITOT 0.9 02/24/2015 1030   BILITOT 0.9 08/26/2014 1042   PROT 7.2 02/24/2015 1030   PROT 6.8 08/26/2014 1042   ALBUMIN 4.0  02/24/2015 1030   ALBUMIN 4.3 08/26/2014 1042     Studies/Results: No results found.  Anti-infectives: Anti-infectives    Start     Dose/Rate Route Frequency Ordered Stop   03/02/15 1600  ceFAZolin (ANCEF) IVPB 1 g/50 mL premix     1 g 100 mL/hr over 30 Minutes Intravenous 3 times per day 03/02/15 1345 03/03/15 0531   03/02/15 0547  ceFAZolin (ANCEF) IVPB 2 g/50 mL premix     2 g 100 mL/hr over 30 Minutes Intravenous On call to O.R. 03/02/15 0547 03/02/15 0718      Assessment Active Problems:  S/p TAH and bilateral salpingectomy, SB resection, primary ventral hernia repair-has a postop ileus   ABL anemia-hemoglobin has been stable post transfusion   Acute kidney injury-resolved    LOS: 4 days   Plan: Hold on diet until bowel function improves.  Start Toradol to help decrease narcotic requirement.  Ambulate.  Check lab tomorrow.   Lisa Flores J 03/06/2015

## 2015-03-06 NOTE — Progress Notes (Signed)
Spoke with Md Rosenbower to notify him that patient's blood pressure is trending upward, MD stated he would resume patient's blood pressure meds Neta Mends RN 3:41 PM 03-06-2015

## 2015-03-06 NOTE — Progress Notes (Signed)
4 Days Post-Op Procedure(s) (LRB): HERNIA REPAIR INCISIONAL (N/A) HYSTERECTOMY TOTAL ABDOMINAL (N/A) BILATERAL SALPINGECTOMY (Bilateral) SMALL BOWEL RESECTION  Subjective: Patient reports nausea, incisional pain and no problems voiding.  Not passing flatus but feels rumbling.  No significant vaginal bleeding.  Objective: I have reviewed patient's vital signs and intake and output.  General: alert  Assessment: s/p Procedure(s) with comments: HERNIA REPAIR INCISIONAL (N/A) - exploratory laparotomy,  HYSTERECTOMY TOTAL ABDOMINAL (N/A) BILATERAL SALPINGECTOMY (Bilateral) SMALL BOWEL RESECTION: stable  Plan: Encourage ambulation  LOS: 4 days    Lisa Flores D 03/06/2015, 8:40 AM

## 2015-03-07 LAB — CBC
HEMATOCRIT: 25.8 % — AB (ref 36.0–46.0)
HEMOGLOBIN: 8.3 g/dL — AB (ref 12.0–15.0)
MCH: 29.1 pg (ref 26.0–34.0)
MCHC: 32.2 g/dL (ref 30.0–36.0)
MCV: 90.5 fL (ref 78.0–100.0)
PLATELETS: 212 10*3/uL (ref 150–400)
RBC: 2.85 MIL/uL — ABNORMAL LOW (ref 3.87–5.11)
RDW: 14.8 % (ref 11.5–15.5)
WBC: 10.2 10*3/uL (ref 4.0–10.5)

## 2015-03-07 LAB — BASIC METABOLIC PANEL
ANION GAP: 7 (ref 5–15)
BUN: 13 mg/dL (ref 6–23)
CHLORIDE: 104 mmol/L (ref 96–112)
CO2: 25 mmol/L (ref 19–32)
Calcium: 7.8 mg/dL — ABNORMAL LOW (ref 8.4–10.5)
Creatinine, Ser: 0.52 mg/dL (ref 0.50–1.10)
GFR calc non Af Amer: >90 mL/min (ref >90)
Glucose, Bld: 99 mg/dL (ref 70–99)
Potassium: 3.1 mmol/L — ABNORMAL LOW (ref 3.5–5.1)
Sodium: 136 mmol/L (ref 135–145)

## 2015-03-07 MED ORDER — KCL IN DEXTROSE-NACL 20-5-0.9 MEQ/L-%-% IV SOLN
INTRAVENOUS | Status: DC
  Administered 2015-03-07 – 2015-03-08 (×3): via INTRAVENOUS
  Filled 2015-03-07 (×3): qty 1000

## 2015-03-07 MED ORDER — POTASSIUM CHLORIDE 10 MEQ/50ML IV SOLN
10.0000 meq | INTRAVENOUS | Status: AC
  Administered 2015-03-07 (×4): 10 meq via INTRAVENOUS
  Filled 2015-03-07 (×4): qty 50

## 2015-03-07 NOTE — Progress Notes (Signed)
5 Days Post-Op  Subjective: Feels better this AM.  The nausea has resolved.  Passing more gas.  Objective: Vital signs in last 24 hours: Temp:  [97.3 F (36.3 C)-98.6 F (37 C)] 98.2 F (36.8 C) (03/22 0552) Pulse Rate:  [60-68] 68 (03/22 0552) Resp:  [13-20] 16 (03/22 0552) BP: (137-198)/(57-71) 150/67 mmHg (03/22 0552) SpO2:  [97 %-100 %] 98 % (03/22 0552) Last BM Date: 03/01/15 (per pt)  Intake/Output from previous day: 03/21 0701 - 03/22 0700 In: 2113.7 [I.V.:2113.7] Out: 2625 [Urine:2625] Intake/Output this shift:    PE: General- In NAD Abdomen-soft, hypoactive bowel sounds, incision is clean and intact, ecchymosis present in lower abdominal wall and mons pubis  Lab Results:   Recent Labs  03/05/15 0518 03/07/15 0550  WBC 12.8* 10.2  HGB 8.6* 8.3*  HCT 26.8* 25.8*  PLT 203 212   BMET  Recent Labs  03/04/15 1005 03/07/15 0550  NA 138 136  K 4.3 3.1*  CL 107 104  CO2 26 25  GLUCOSE 121* 99  BUN 19 13  CREATININE 0.71 0.52  CALCIUM 7.9* 7.8*   PT/INR No results for input(s): LABPROT, INR in the last 72 hours. Comprehensive Metabolic Panel:    Component Value Date/Time   NA 136 03/07/2015 0550   NA 138 03/04/2015 1005   K 3.1* 03/07/2015 0550   K 4.3 03/04/2015 1005   CL 104 03/07/2015 0550   CL 107 03/04/2015 1005   CO2 25 03/07/2015 0550   CO2 26 03/04/2015 1005   BUN 13 03/07/2015 0550   BUN 19 03/04/2015 1005   CREATININE 0.52 03/07/2015 0550   CREATININE 0.71 03/04/2015 1005   CREATININE 0.51 08/26/2014 1042   CREATININE 0.77 06/13/2014 1708   GLUCOSE 99 03/07/2015 0550   GLUCOSE 121* 03/04/2015 1005   CALCIUM 7.8* 03/07/2015 0550   CALCIUM 7.9* 03/04/2015 1005   AST 17 02/24/2015 1030   AST 16 08/26/2014 1042   ALT 14 02/24/2015 1030   ALT 26 08/26/2014 1042   ALKPHOS 57 02/24/2015 1030   ALKPHOS 48 08/26/2014 1042   BILITOT 0.9 02/24/2015 1030   BILITOT 0.9 08/26/2014 1042   PROT 7.2 02/24/2015 1030   PROT 6.8 08/26/2014  1042   ALBUMIN 4.0 02/24/2015 1030   ALBUMIN 4.3 08/26/2014 1042     Studies/Results: No results found.  Anti-infectives: Anti-infectives    Start     Dose/Rate Route Frequency Ordered Stop   03/02/15 1600  ceFAZolin (ANCEF) IVPB 1 g/50 mL premix     1 g 100 mL/hr over 30 Minutes Intravenous 3 times per day 03/02/15 1345 03/03/15 0531   03/02/15 0547  ceFAZolin (ANCEF) IVPB 2 g/50 mL premix     2 g 100 mL/hr over 30 Minutes Intravenous On call to O.R. 03/02/15 0547 03/02/15 0718      Assessment Active Problems:  S/p TAH and bilateral salpingectomy, SB resection, primary ventral hernia repair-ileus is improving   ABL anemia-hemoglobin has been stable post transfusion   Acute kidney injury-resolved   Hypokalemia    LOS: 5 days   Plan:  Full liquid diet.  if tolerated advance to regular diet tomorrow, d/c PCA and start oral analgesic.  Correct potassium deficit.  May need to go home on Iron and Colace.  I will be out of town for the rest of the week.  Dr. Excell Seltzer and [facility] are covering for me.  Keonna Raether J 03/07/2015

## 2015-03-07 NOTE — Progress Notes (Signed)
Patient is alert and oriented, vital signs are stable, incisions with normal limits except extensive bruising and MD Rosenbower is aware, patient up and ambulating around the nursing station today, patient tolerated full liquid diet without complaints of nausea or vomiting Neta Mends RN 03-07-2015

## 2015-03-07 NOTE — Progress Notes (Signed)
5 Days Post-Op Procedure(s) (LRB): HERNIA REPAIR INCISIONAL (N/A) HYSTERECTOMY TOTAL ABDOMINAL (N/A) BILATERAL SALPINGECTOMY (Bilateral) SMALL BOWEL RESECTION  Subjective: Patient reports incisional pain, tolerating PO, + flatus and no problems voiding.    Objective: I have reviewed patient's vital signs, intake and output and labs.  General: alert GI: ecchymosis left lower abdomen and mons/vulva  Assessment: s/p Procedure(s) with comments: HERNIA REPAIR INCISIONAL (N/A) - exploratory laparotomy,  HYSTERECTOMY TOTAL ABDOMINAL (N/A) BILATERAL SALPINGECTOMY (Bilateral) SMALL BOWEL RESECTION: stable and progressing well  Plan: Advance diet Encourage ambulation  I will need to see her 6 weeks after discharge Reviewed pathology results with her  LOS: 5 days    Lisa Flores D 03/07/2015, 8:12 AM

## 2015-03-08 LAB — BASIC METABOLIC PANEL
Anion gap: 7 (ref 5–15)
BUN: 12 mg/dL (ref 6–23)
CALCIUM: 8 mg/dL — AB (ref 8.4–10.5)
CO2: 24 mmol/L (ref 19–32)
CREATININE: 0.53 mg/dL (ref 0.50–1.10)
Chloride: 102 mmol/L (ref 96–112)
GFR calc Af Amer: >90 mL/min (ref >90)
GFR calc non Af Amer: >90 mL/min (ref >90)
Glucose, Bld: 99 mg/dL (ref 70–99)
Potassium: 3.8 mmol/L (ref 3.5–5.1)
Sodium: 133 mmol/L — ABNORMAL LOW (ref 135–145)

## 2015-03-08 MED ORDER — PANTOPRAZOLE SODIUM 40 MG PO TBEC
40.0000 mg | DELAYED_RELEASE_TABLET | Freq: Every day | ORAL | Status: DC
  Administered 2015-03-08: 40 mg via ORAL
  Filled 2015-03-08 (×3): qty 1

## 2015-03-08 MED ORDER — OXYCODONE-ACETAMINOPHEN 5-325 MG PO TABS
1.0000 | ORAL_TABLET | ORAL | Status: DC | PRN
  Administered 2015-03-08 (×2): 2 via ORAL
  Administered 2015-03-08 (×2): 1 via ORAL
  Filled 2015-03-08 (×2): qty 1
  Filled 2015-03-08 (×2): qty 2

## 2015-03-08 MED ORDER — MORPHINE SULFATE 2 MG/ML IJ SOLN
1.0000 mg | INTRAMUSCULAR | Status: DC | PRN
  Administered 2015-03-08 (×2): 2 mg via INTRAVENOUS
  Filled 2015-03-08 (×3): qty 1

## 2015-03-08 NOTE — Progress Notes (Signed)
Key Points: Use following P&T approved IV to PO antibiotic change policy.  Description contains the criteria that are approved Note: Policy Excludes:  Esophagectomy patientsPHARMACIST - PHYSICIAN COMMUNICATION DR: Excell Seltzer CONCERNING: IV to Oral Route Change Policy  RECOMMENDATION: This patient is receiving Protonix by the intravenous route.  Based on criteria approved by the Pharmacy and Therapeutics Committee, the intravenous medication(s) is/are being converted to the equivalent oral dose form(s).   DESCRIPTION: These criteria include:  The patient is eating (either orally or via tube) and/or has been taking other orally administered medications for a least 24 hours  The patient has no evidence of active gastrointestinal bleeding or impaired GI absorption (gastrectomy, short bowel, patient on TNA or NPO).  If you have questions about this conversion, please contact the Pharmacy Department  []   [phone] )  [facility] []   [phone] )  [facility]  []   [phone] )  [facility] [x]   [phone] )  [place], [place], [facility] 03/08/2015 12:22 PM

## 2015-03-08 NOTE — Plan of Care (Signed)
Problem: Phase II Progression Outcomes Goal: Return of bowel function (flatus, BM) IF ABDOMINAL SURGERY:  Outcome: Progressing Pt beginning to report flatus.

## 2015-03-08 NOTE — Progress Notes (Signed)
Patient ID: Lisa Flores, female   DOB: 08-Oct-1961, 54 y.o.   MRN: [id number] 6 Days Post-Op  Subjective: Gradually feeling better. Still some incisional pain. Tolerating liquid diet well. Having flatus but no bowel movements. No nausea.  Objective: Vital signs in last 24 hours: Temp:  [97.7 F (36.5 C)-99.1 F (37.3 C)] 98.7 F (37.1 C) (03/23 0751) Pulse Rate:  [67-80] 67 (03/23 0751) Resp:  [13-21] 13 (03/23 0751) BP: (122-159)/(52-85) 122/85 mmHg (03/23 0751) SpO2:  [95 %-100 %] 100 % (03/23 0751) Last BM Date: 03/01/15 (per pt)  Intake/Output from previous day: 03/22 0701 - 03/23 0700 In: 2370 [P.O.:1080; I.V.:1090; IV Piggyback:200] Out: 1750 [Urine:1750] Intake/Output this shift: Total I/O In: -  Out: 350 [Urine:350]  General appearance: alert, cooperative and fatigued GI: normal findings: soft, non-tender Incision/Wound: no erythema or drainage  Lab Results:   Recent Labs  03/07/15 0550  WBC 10.2  HGB 8.3*  HCT 25.8*  PLT 212   BMET  Recent Labs  03/07/15 0550 03/08/15 0453  NA 136 133*  K 3.1* 3.8  CL 104 102  CO2 25 24  GLUCOSE 99 99  BUN 13 12  CREATININE 0.52 0.53  CALCIUM 7.8* 8.0*     Studies/Results: No results found.  Anti-infectives: Anti-infectives    Start     Dose/Rate Route Frequency Ordered Stop   03/02/15 1600  ceFAZolin (ANCEF) IVPB 1 g/50 mL premix     1 g 100 mL/hr over 30 Minutes Intravenous 3 times per day 03/02/15 1345 03/03/15 0531   03/02/15 0547  ceFAZolin (ANCEF) IVPB 2 g/50 mL premix     2 g 100 mL/hr over 30 Minutes Intravenous On call to O.R. 03/02/15 0547 03/02/15 0718      Assessment/Plan: s/p Procedure(s): HERNIA REPAIR INCISIONAL HYSTERECTOMY TOTAL ABDOMINAL BILATERAL SALPINGECTOMY SMALL BOWEL RESECTION Postop blood loss anemia, stabilized Progressing well. Advance diet. Change to oral pain medications. Hopefully home next 1-2 days.   LOS: 6 days    Zyanna Leisinger T 03/08/2015

## 2015-03-08 NOTE — Progress Notes (Signed)
POD #6 TAH, ventral hernia repair Doing ok, ecchymosis is sore Afeb, VSS Continue care per surgeons, no vaginal bleeding

## 2015-03-09 MED ORDER — OXYCODONE-ACETAMINOPHEN 5-325 MG PO TABS: 1.0000 | ORAL_TABLET | Freq: Four times a day (QID) | ORAL | Status: DC | PRN

## 2015-03-09 NOTE — Progress Notes (Signed)
Patient ID: Lisa Flores, female   DOB: June 04, 1961, 54 y.o.   MRN: [id number] 7 Days Post-Op  Subjective: No major complaints this morning. Tolerating regular diet. Has had flatus but no bowel movements. Up out of bed.  Objective: Vital signs in last 24 hours: Temp:  [97.5 F (36.4 C)-99.7 F (37.6 C)] 97.5 F (36.4 C) (03/24 0538) Pulse Rate:  [63-73] 66 (03/24 0538) Resp:  [15-18] 18 (03/24 0538) BP: (131-155)/(53-73) 131/56 mmHg (03/24 0538) SpO2:  [94 %-100 %] 99 % (03/24 0538) Last BM Date: 03/01/15  Intake/Output from previous day: 03/23 0701 - 03/24 0700 In: 2160 [P.O.:960; I.V.:1200] Out: 3700 [Urine:3700] Intake/Output this shift: Total I/O In: 10 [I.V.:10] Out: -   General appearance: alert, cooperative and no distress GI: Obese, soft and nontender Incision/Wound: Clean and dry  Lab Results:   Recent Labs  03/07/15 0550  WBC 10.2  HGB 8.3*  HCT 25.8*  PLT 212   BMET  Recent Labs  03/07/15 0550 03/08/15 0453  NA 136 133*  K 3.1* 3.8  CL 104 102  CO2 25 24  GLUCOSE 99 99  BUN 13 12  CREATININE 0.52 0.53  CALCIUM 7.8* 8.0*     Studies/Results: No results found.  Anti-infectives: Anti-infectives    Start     Dose/Rate Route Frequency Ordered Stop   03/02/15 1600  ceFAZolin (ANCEF) IVPB 1 g/50 mL premix     1 g 100 mL/hr over 30 Minutes Intravenous 3 times per day 03/02/15 1345 03/03/15 0531   03/02/15 0547  ceFAZolin (ANCEF) IVPB 2 g/50 mL premix     2 g 100 mL/hr over 30 Minutes Intravenous On call to O.R. 03/02/15 0547 03/02/15 0718      Assessment/Plan: s/p Procedure(s): HERNIA REPAIR INCISIONAL HYSTERECTOMY TOTAL ABDOMINAL BILATERAL SALPINGECTOMY SMALL BOWEL RESECTION Doing well at this point. No evidence of further bleeding. Okay for discharge.   LOS: 7 days    Kamori Kitchens T 03/09/2015

## 2015-03-09 NOTE — Progress Notes (Signed)
Patient is alert and oriented, vital signs are stable, incisions are within normal limits, iv contacted for PICC line removal, discharge instructions reviewed with patient and patient's mother, prescription given and questions and concerns answered, patient instructed to call [facility] to set up appointment for staple removal Neta Mends RN 10:54 AM 03-09-2015

## 2015-03-09 NOTE — Progress Notes (Signed)
PICC removed per order.  Site without signs of infection or abnormalities.  Site cleaned with CHG, vaseline and dry 4x4 applied to site.  Verbalizes understanding of drsg care, signs of infection and bleeding and when to call MD via teachback method.  Family at bedside also verbalized understanding.

## 2015-03-09 NOTE — Progress Notes (Signed)
POD #7 Doing better, no vaginal bleeding Afeb, VSS Ok for discharge, f/u 6 weeks

## 2015-03-28 NOTE — Discharge Summary (Signed)
Physician Discharge Summary  Patient ID: Lisa Flores MRN: [id number] DOB/AGE: 52-Jun-1962 53 y.o.  Admit date: 03/02/2015 Discharge date: 03/09/2015  Admission Diagnoses:  Ventral Hernia, mass on cervix  Discharge Diagnoses:  Ventral Hernia Benign mass on cervix Acute Blood Loss Anemia Postop Ileus   Discharged Condition: good  Hospital Course: She was admitted and under went total abdominal hysterectomy, bilateral salpingectomy, small bowel resection and primary ventral hernia repair 03/02/15.  She developed self limited postop bleeding with a hemoglobin nadir of 6.7.  She was given a PRBC transfusion with appropriate response.  She also had a postop ileus which slowly resolved.  By her discharge date her bowel function had returned, she was ambulating, and she was tolerating a solid diet.  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  She was discharged to home on 03/09/15.  Consults: gynecology   Discharge Exam: Blood pressure 127/95, pulse 70, temperature 98.6 F (37 C), temperature source Oral, resp. rate 18, height 4\' 8"  (1.422 m), weight 95.142 kg (209 lb 12 oz), last menstrual period 03/01/2015, SpO2 98 %.   Disposition: 01-Home or Self Care  Discharge Instructions    Discharge patient    Complete by:  As directed             Medication List    STOP taking these medications        fluconazole 150 MG tablet  Commonly known as:  DIFLUCAN      TAKE these medications        amLODipine 10 MG tablet  Commonly known as:  NORVASC  Take 10 mg by mouth daily.     CRANBERRY PLUS VITAMIN C PO  Take 1 tablet by mouth daily.     etodolac 400 MG tablet  Commonly known as:  LODINE  Take 400 mg by mouth 2 (two) times daily.     FLUoxetine 20 MG capsule  Commonly known as:  PROZAC  Take 20 mg by mouth daily.     oxyCODONE-acetaminophen 5-325 MG per tablet  Commonly known as:  PERCOCET/ROXICET  Take 1-2 tablets  by mouth every 6 (six) hours as needed for moderate pain.           Follow-up Information    Follow up with Tunisia Landgrebe J, MD. Schedule an appointment as soon as possible for a visit in 1 week.   Specialty:  General Surgery   Why:  For suture removal   Contact information:   [address] [place] [address] [phone]       Follow up with MEISINGER,Damon Hargrove D, MD. Schedule an appointment as soon as possible for a visit in 5 weeks.   Specialty:  Obstetrics and Gynecology   Contact information:   [address] [place] [address] [phone]       Signed: Odis Hollingshead 03/28/2015, 1:34 PM

## 2015-06-09 IMAGING — CT CT ABD-PELV W/ CM
3 of 5 series · 13 of 36 positions shown, 19 images · IV contrast (READICAT/WATER & [ID] OMNI 300)
Comparison: None.

CLINICAL DATA: Abdominal mass

EXAM:
CT ABDOMEN AND PELVIS WITH CONTRAST
TECHNIQUE: Multidetector CT imaging of the abdomen and pelvis was performed
using the standard protocol following bolus administration of
intravenous contrast.
CONTRAST:  125mL OMNIPAQUE IOHEXOL 300 MG/ML  SOLN

[Series 3: abd/pelvis with · axial · 0.86mm/px · z∈[-348,-68]mm · 6 of 80 slices shown, 11 images]
[im 12/80  soft-tissue]
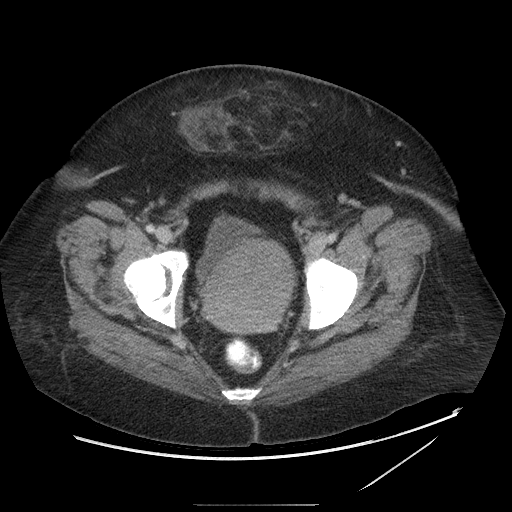
[im 12/80  bone]
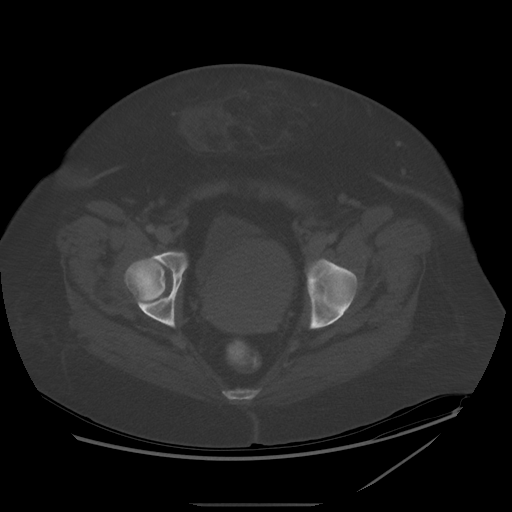
[im 23/80  soft-tissue]
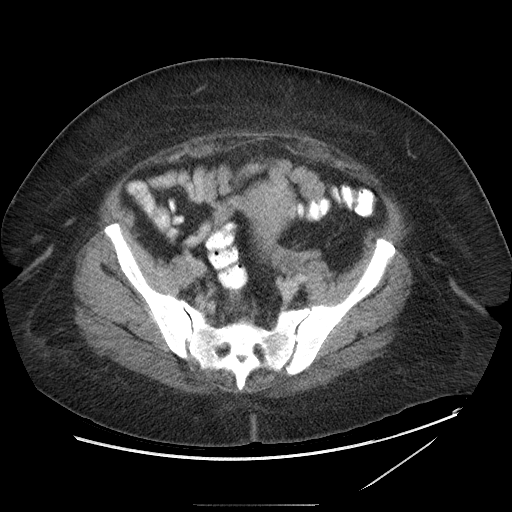
[im 34/80  soft-tissue]
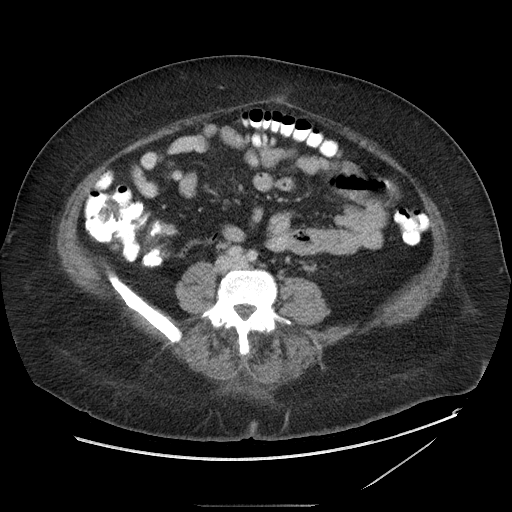
[im 34/80  lung]
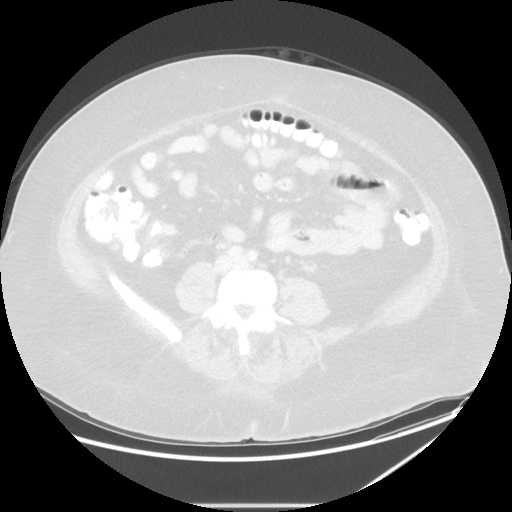
[im 46/80  soft-tissue]
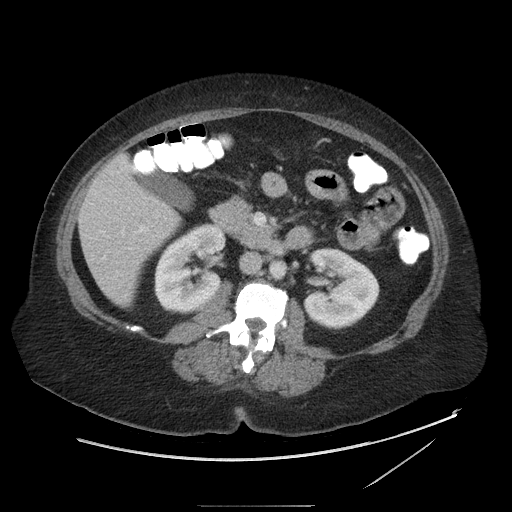
[im 46/80  lung]
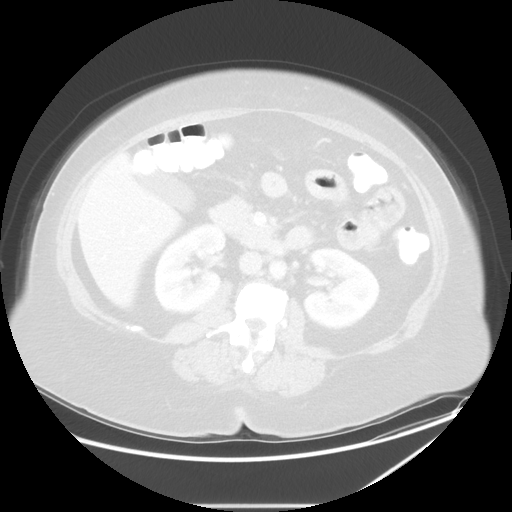
[im 57/80  soft-tissue]
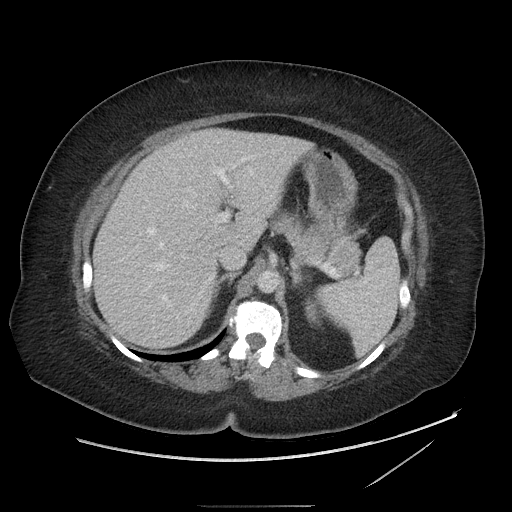
[im 57/80  lung]
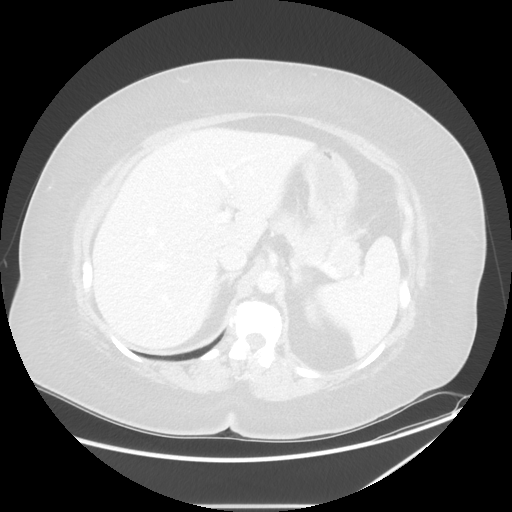
[im 68/80  soft-tissue]
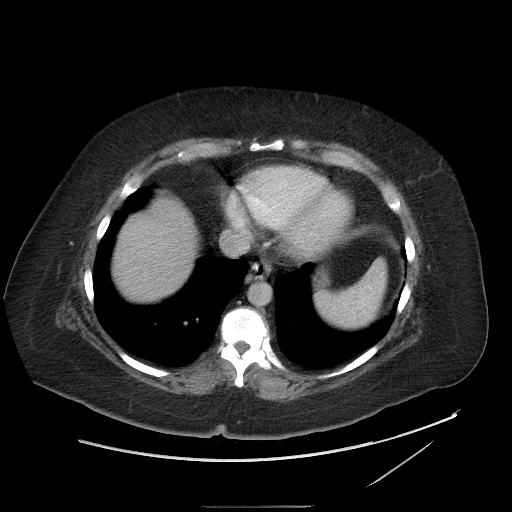
[im 68/80  lung]
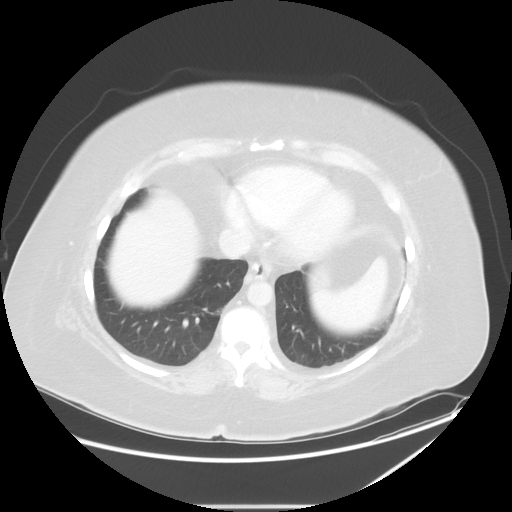

[Series 601: coronal body · coronal · 0.86mm/px · 1 of 138 slices shown, 2 images]
[im 46/138  soft-tissue]
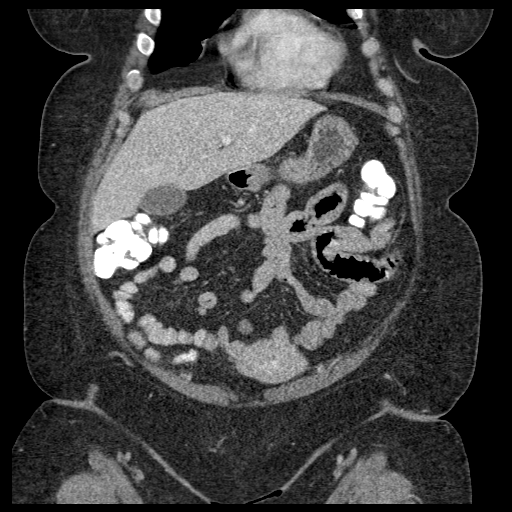
[im 46/138  bone]
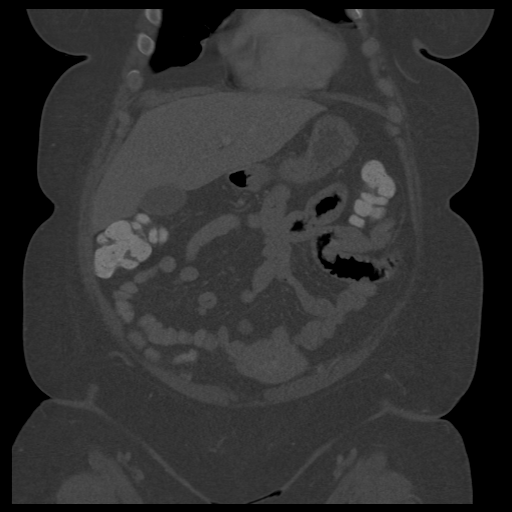

[Series 602: sagittal body · sagittal · 0.86mm/px · 6 of 177 slices shown]
[im 20/177  soft-tissue]
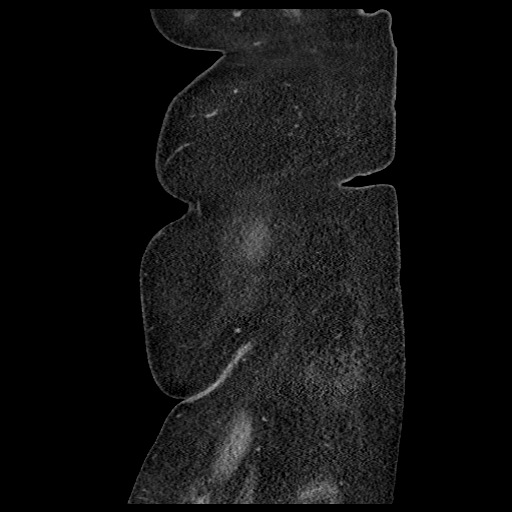
[im 40/177  soft-tissue]
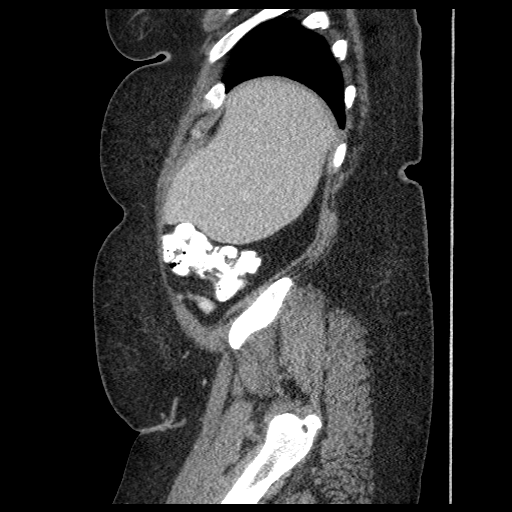
[im 59/177  soft-tissue]
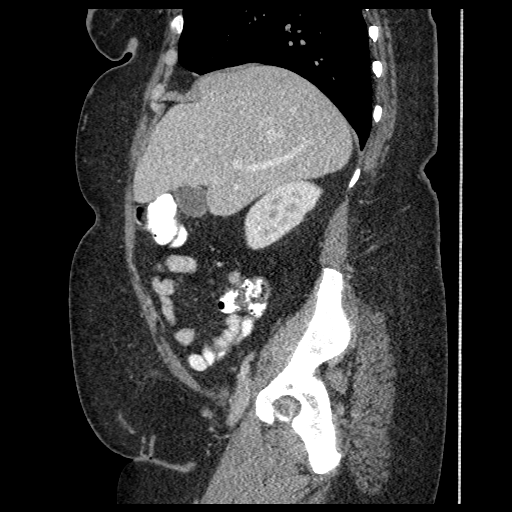
[im 79/177  soft-tissue]
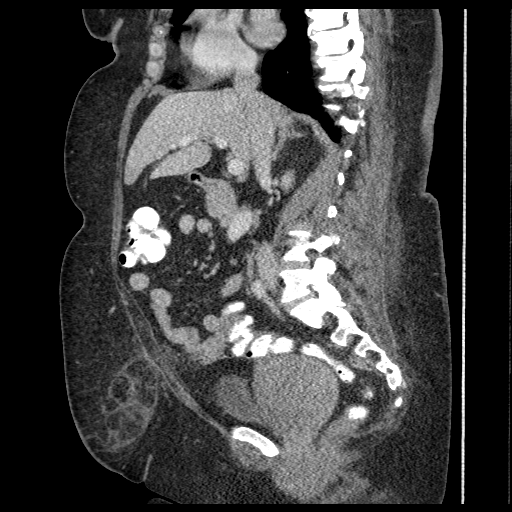
[im 98/177  soft-tissue]
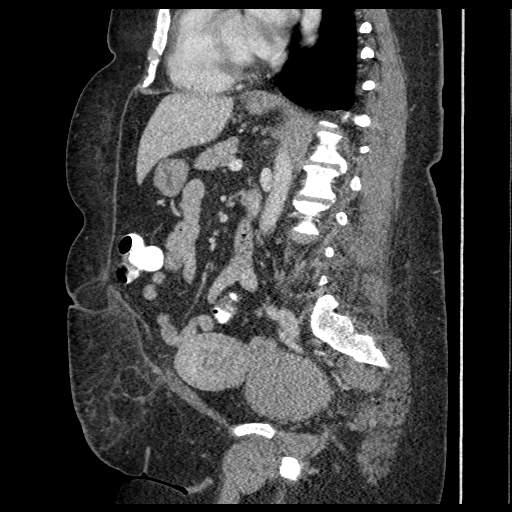
[im 118/177  soft-tissue]
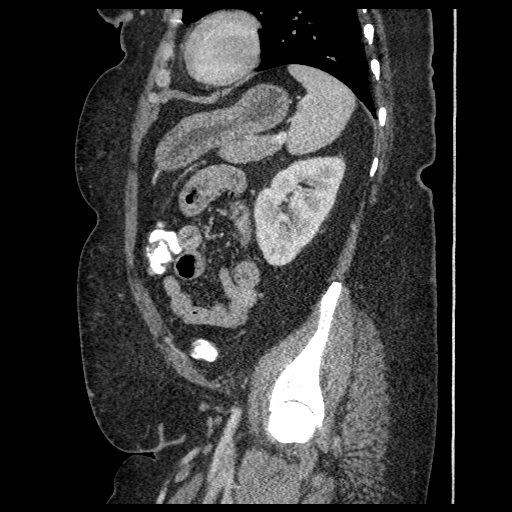

[13 of 36 positions shown; findings below may reference images not displayed]

FINDINGS: Palpable mass in the anterior pelvis corresponds to abdominal wall
hernia at the level of the prior incision for C-section. The hernia
contains predominantly fat as well as a small amount of fluid. No
bowel contents. This is a moderately large hernia. In addition there
is a small umbilical hernia containing fat.

Lung bases are clear. Liver and spleen are normal. Gallbladder and
bile ducts are normal. Pancreas is normal. Kidneys are normal
without obstruction or mass or calculus.

Negative for bowel obstruction or bowel thickening. Appendix is
normal. Negative for diverticular change.

Large mass in the central pelvis arising from the area of the
cervix. The mass measures 8.0 x 8.3 cm on transverse images. The
mass has homogeneous soft tissue density and is well-circumscribed.
The fundus of the uterus is normal.

No free fluid or adenopathy. Lumbar degenerative change without
acute bony abnormality.
IMPRESSION: Palpable mass in the anterior pelvis corresponds to a moderately
large incisional hernia. The hernia sac contains fat with a small
amount of fluid.

Small umbilical hernia.

Large homogeneous soft tissue mass arising from the area of the
cervix. This measures 8 x 8.3 cm this may represent cervical
carcinoma or a fibroid tumor. The mass is well circumscribed. Pelvic
MRI with contrast may be helpful to further characterize.

## 2016-10-30 ENCOUNTER — Ambulatory Visit: Payer: Self-pay | Admitting: General Surgery

## 2016-10-30 NOTE — H&P (Signed)
Lisa Flores 10/30/2016 10:01 AM Location: [facility] Patient #: [id number] DOB: [birth year] Married / Language: Lisa Flores / Race: White Female   History of Present Illness Lisa Hollingshead MD; 10/30/2016 10:33 AM) The patient is a 55 year old female.  Note:She presents today Complaining of some periumbilical swelling and pain that began about 2 months ago. Sometimes the pain is quite severe depending on which way she moves or sits. This week it has not been as bad. Of note is that she underwent exploratory laparotomy, small bowel resection, and primary repair of an incisional hernia March 02, 2015 at the same time she was having a hysterectomy. We did not use a mesh at that time because of the clean contaminated nature of the case. She was aware that her risk of recurrence was higher. She has no symptoms of obstruction. No difficulty with voiding or bowel movements.  Allergies (Lisa Flores, CMA; 10/30/2016 10:02 AM) No Known Drug Allergies02/12/2014  Medication History (Lisa Flores, CMA; 10/30/2016 10:03 AM) Valsartan (160MG  Tablet, Oral) Active. AmLODIPine Besylate (10MG  Tablet, Oral) Active. Medications Reconciled    Review of Systems Lisa Hollingshead MD; 10/30/2016 10:33 AM)  Note: No nausea or vomiting. No constipation or difficulty with urination.   Vitals (Lisa Flores CMA; 10/30/2016 10:04 AM) 10/30/2016 10:03 AM Weight: 233 lb Height: 56in Height was reported by patient. Body Surface Area: 1.89 m Body Mass Index: 52.24 kg/m  Temp.: 98.71F(Oral)  Pulse: 70 (Regular)  P.OX: 97% (Room air) BP: 140/80 (Sitting, Left Arm, Standard)       Physical Exam Lisa Hollingshead MD; 10/30/2016 10:35 AM) The physical exam findings are as follows: Note:General: Morbidly obese female in NAD. Pleasant and cooperative.  HEENT: Lisa Flores/AT, no external nasal or ear masses, mucous membranes are moist  EYES: EOMI, no scleral icterus, pupils  normal  NECK: Supple, no obvious mass or thyroid mass/enlargement, no trachea deviation  CV: RRR, no murmur, no edema  CHEST: Breath sounds equal and clear. Respirations nonlabored.  ABDOMEN: Soft, nontender, nondistended, significantly obese, lower midline scar with umbilical bulge that is only partially reducible.  MUSCULOSKELETAL: FROM, good muscle tone, no edema, no venous stasis changes, normal station and gait  SKIN: No jaundice.  NEUROLOGIC: Alert and oriented, answers questions appropriately, normal gait and station.  PSYCHIATRIC: Normal mood, affect , and behavior.    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 The hernia is symptomatic. I recommended laparoscopic repair with mesh and she is interested in that.  Plan: Laparoscopic repair of recurrent ventral hernia with mesh. I have discussed the procedure, risks, and aftercare. Risks include but are not limited to bleeding, infection, wound healing problems, anesthesia, recurrence, accidental injury to intra-abdominal organs-such as intestine, liver, spleen, bladder, etc. We also discussed the rare complication of mesh rejection. All questions were answered.  Lisa Flores, M.D.

## 2016-11-15 ENCOUNTER — Encounter (HOSPITAL_COMMUNITY): Payer: Self-pay | Admitting: *Deleted

## 2016-11-22 ENCOUNTER — Encounter (HOSPITAL_COMMUNITY): Payer: Self-pay

## 2016-11-22 ENCOUNTER — Encounter (INDEPENDENT_AMBULATORY_CARE_PROVIDER_SITE_OTHER): Payer: Self-pay

## 2016-11-22 ENCOUNTER — Encounter (HOSPITAL_COMMUNITY)
Admission: RE | Admit: 2016-11-22 | Discharge: 2016-11-22 | Disposition: A | Discharge status: Home / Self Care | Payer: BLUE CROSS/BLUE SHIELD | Source: Ambulatory Visit | Attending: General Surgery | Admitting: General Surgery

## 2016-11-22 DIAGNOSIS — Z01818 Encounter for other preprocedural examination: Secondary | ICD-10-CM | POA: Insufficient documentation

## 2016-11-22 HISTORY — DX: Panic disorder (episodic paroxysmal anxiety): F41.0

## 2016-11-22 HISTORY — DX: Major depressive disorder, single episode, unspecified: F32.9

## 2016-11-22 HISTORY — DX: Depression, unspecified: F32.A

## 2016-11-22 HISTORY — DX: Personal history of other medical treatment: Z92.89

## 2016-11-22 HISTORY — DX: Unspecified osteoarthritis, unspecified site: M19.90

## 2016-11-22 LAB — COMPREHENSIVE METABOLIC PANEL
ALBUMIN: 4 g/dL (ref 3.5–5.0)
ALK PHOS: 64 U/L (ref 38–126)
ALT: 16 U/L (ref 14–54)
AST: 21 U/L (ref 15–41)
Anion gap: 8 (ref 5–15)
BUN: 29 mg/dL — AB (ref 6–20)
CALCIUM: 9.1 mg/dL (ref 8.9–10.3)
CO2: 26 mmol/L (ref 22–32)
CREATININE: 0.78 mg/dL (ref 0.44–1.00)
Chloride: 106 mmol/L (ref 101–111)
GFR calc non Af Amer: >60 mL/min (ref >60)
GLUCOSE: 98 mg/dL (ref 65–99)
Potassium: 4.2 mmol/L (ref 3.5–5.1)
SODIUM: 140 mmol/L (ref 135–145)
Total Bilirubin: 0.7 mg/dL (ref 0.3–1.2)
Total Protein: 7.2 g/dL (ref 6.5–8.1)

## 2016-11-22 LAB — CBC WITH DIFFERENTIAL/PLATELET
Basophils Absolute: 0 10*3/uL (ref 0.0–0.1)
Basophils Relative: 0 %
EOS ABS: 0.1 10*3/uL (ref 0.0–0.7)
Eosinophils Relative: 1 %
HCT: 35.4 % — ABNORMAL LOW (ref 36.0–46.0)
HEMOGLOBIN: 10.7 g/dL — AB (ref 12.0–15.0)
LYMPHS ABS: 2 10*3/uL (ref 0.7–4.0)
Lymphocytes Relative: 22 %
MCH: 24.4 pg — AB (ref 26.0–34.0)
MCHC: 30.2 g/dL (ref 30.0–36.0)
MCV: 80.6 fL (ref 78.0–100.0)
Monocytes Absolute: 0.5 10*3/uL (ref 0.1–1.0)
Monocytes Relative: 6 %
NEUTROS PCT: 71 %
Neutro Abs: 6.3 10*3/uL (ref 1.7–7.7)
Platelets: 303 10*3/uL (ref 150–400)
RBC: 4.39 MIL/uL (ref 3.87–5.11)
RDW: 17.6 % — ABNORMAL HIGH (ref 11.5–15.5)
WBC: 8.9 10*3/uL (ref 4.0–10.5)

## 2016-11-22 NOTE — Progress Notes (Addendum)
CBCD and CMP results in epic per PAT visit 11/22/2016 sent to Dr Zella Richer

## 2016-11-22 NOTE — Patient Instructions (Signed)
Lisa Flores  11/22/2016   Your procedure is scheduled on: Monday November 25, 2016  Report to [facility] Main  Entrance take [place]  elevators to 3rd floor to  [facility] at 5:30 AM.  Call this number if you have problems the morning of surgery [phone]   Remember: ONLY 1 PERSON MAY GO WITH YOU TO SHORT STAY TO GET  READY MORNING OF [place].  Do not eat food or drink liquids :After Midnight.     Take these medicines the morning of surgery with A SIP OF WATER: Amlodipine (Norvasc); Bupropion (Wellbutrin)                                You may not have any metal on your body including hair pins and              piercings  Do not wear jewelry, make-up, lotions, powders or perfumes, deodorant             Do not wear nail polish.  Do not shave  48 hours prior to surgery.          Do not bring valuables to the hospital. [place].  Contacts, dentures or bridgework may not be worn into surgery.  Leave suitcase in the car. After surgery it may be brought to your room.               _____________________________________________________________________             [facility] - Preparing for Surgery Before surgery, you can play an important role.  Because skin is not sterile, your skin needs to be as free of germs as possible.  You can reduce the number of germs on your skin by washing with CHG (chlorahexidine gluconate) soap before surgery.  CHG is an antiseptic cleaner which kills germs and bonds with the skin to continue killing germs even after washing. Please DO NOT use if you have an allergy to CHG or antibacterial soaps.  If your skin becomes reddened/irritated stop using the CHG and inform your nurse when you arrive at Short Stay. Do not shave (including legs and underarms) for at least 48 hours prior to the first CHG shower.  You may shave your face/neck. Please follow these instructions  carefully:  1.  Shower with CHG Soap the night before surgery and the  morning of Surgery.  2.  If you choose to wash your hair, wash your hair first as usual with your  normal  shampoo.  3.  After you shampoo, rinse your hair and body thoroughly to remove the  shampoo.                           4.  Use CHG as you would any other liquid soap.  You can apply chg directly  to the skin and wash                       Gently with a scrungie or clean washcloth.  5.  Apply the CHG Soap to your body ONLY FROM THE NECK DOWN.   Do not use on face/ open  Wound or open sores. Avoid contact with eyes, ears mouth and genitals (private parts).                       Wash face,  Genitals (private parts) with your normal soap.             6.  Wash thoroughly, paying special attention to the area where your surgery  will be performed.  7.  Thoroughly rinse your body with warm water from the neck down.  8.  DO NOT shower/wash with your normal soap after using and rinsing off  the CHG Soap.                9.  Pat yourself dry with a clean towel.            10.  Wear clean pajamas.            11.  Place clean sheets on your bed the night of your first shower and do not  sleep with pets. Day of Surgery : Do not apply any lotions/deodorants the morning of surgery.  Please wear clean clothes to the hospital/surgery center.  FAILURE TO FOLLOW THESE INSTRUCTIONS MAY RESULT IN THE CANCELLATION OF YOUR SURGERY PATIENT SIGNATURE_________________________________  NURSE SIGNATURE__________________________________  ________________________________________________________________________

## 2016-11-24 NOTE — Anesthesia Preprocedure Evaluation (Addendum)
Anesthesia Evaluation  Patient identified by MRN, date of birth, ID band Patient awake    Reviewed: Allergy & Precautions, H&P , NPO status , Patient's Chart, lab work & pertinent test results  Airway Mallampati: III  TM Distance: >3 FB Neck ROM: Full    Dental no notable dental hx. (+) Teeth Intact, Dental Advisory Given   Pulmonary neg pulmonary ROS,    Pulmonary exam normal breath sounds clear to auscultation       Cardiovascular hypertension, Pt. on medications  Rhythm:Regular Rate:Normal     Neuro/Psych  Headaches, Anxiety Depression negative psych ROS   GI/Hepatic negative GI ROS, Neg liver ROS,   Endo/Other  Morbid obesity  Renal/GU negative Renal ROS  negative genitourinary   Musculoskeletal  (+) Arthritis , Osteoarthritis,    Abdominal   Peds  Hematology negative hematology ROS (+)   Anesthesia Other Findings   Reproductive/Obstetrics negative OB ROS                           Anesthesia Physical Anesthesia Plan  ASA: III  Anesthesia Plan: General   Post-op Pain Management:    Induction: Intravenous  Airway Management Planned: Oral ETT  Additional Equipment:   Intra-op Plan:   Post-operative Plan: Extubation in OR  Informed Consent: I have reviewed the patients History and Physical, chart, labs and discussed the procedure including the risks, benefits and alternatives for the proposed anesthesia with the patient or authorized representative who has indicated his/her understanding and acceptance.   Dental advisory given  Plan Discussed with: CRNA  Anesthesia Plan Comments:         Anesthesia Quick Evaluation

## 2016-11-25 ENCOUNTER — Inpatient Hospital Stay (HOSPITAL_COMMUNITY)
Admission: RE | Admit: 2016-11-25 | Discharge: 2016-11-28 | DRG: 354 | Disposition: A | Discharge status: Home / Self Care | Payer: BLUE CROSS/BLUE SHIELD | Source: Ambulatory Visit | Attending: General Surgery | Admitting: General Surgery

## 2016-11-25 ENCOUNTER — Encounter (HOSPITAL_COMMUNITY)
Admission: RE | Disposition: A | Discharge status: Home / Self Care | Payer: Self-pay | Source: Ambulatory Visit | Attending: General Surgery

## 2016-11-25 ENCOUNTER — Encounter (HOSPITAL_COMMUNITY): Payer: Self-pay | Admitting: *Deleted

## 2016-11-25 ENCOUNTER — Ambulatory Visit (HOSPITAL_COMMUNITY): Payer: BLUE CROSS/BLUE SHIELD | Admitting: Anesthesiology

## 2016-11-25 DIAGNOSIS — Z6841 Body Mass Index (BMI) 40.0 and over, adult: Secondary | ICD-10-CM

## 2016-11-25 DIAGNOSIS — K432 Incisional hernia without obstruction or gangrene: Secondary | ICD-10-CM | POA: Diagnosis present

## 2016-11-25 DIAGNOSIS — I10 Essential (primary) hypertension: Secondary | ICD-10-CM | POA: Diagnosis present

## 2016-11-25 DIAGNOSIS — Z90710 Acquired absence of both cervix and uterus: Secondary | ICD-10-CM

## 2016-11-25 DIAGNOSIS — R110 Nausea: Secondary | ICD-10-CM | POA: Diagnosis not present

## 2016-11-25 DIAGNOSIS — K430 Incisional hernia with obstruction, without gangrene: Principal | ICD-10-CM | POA: Diagnosis present

## 2016-11-25 HISTORY — PX: VENTRAL HERNIA REPAIR: SHX424

## 2016-11-25 HISTORY — PX: INSERTION OF MESH: SHX5868

## 2016-11-25 SURGERY — REPAIR, HERNIA, VENTRAL, LAPAROSCOPIC
Anesthesia: General | Site: Abdomen

## 2016-11-25 MED ORDER — SUGAMMADEX SODIUM 500 MG/5ML IV SOLN
INTRAVENOUS | Status: DC | PRN
  Administered 2016-11-25: 400 mg via INTRAVENOUS

## 2016-11-25 MED ORDER — BUPROPION HCL ER (XL) 150 MG PO TB24
150.0000 mg | ORAL_TABLET | Freq: Every day | ORAL | Status: DC
  Administered 2016-11-25 – 2016-11-28 (×4): 150 mg via ORAL
  Filled 2016-11-25 (×5): qty 1

## 2016-11-25 MED ORDER — HYDROMORPHONE HCL 1 MG/ML IJ SOLN: 0.2500 mg | INTRAMUSCULAR | Status: DC | PRN

## 2016-11-25 MED ORDER — CHLORHEXIDINE GLUCONATE CLOTH 2 % EX PADS: 6.0000 | MEDICATED_PAD | Freq: Once | CUTANEOUS | Status: DC

## 2016-11-25 MED ORDER — DEXAMETHASONE SODIUM PHOSPHATE 10 MG/ML IJ SOLN
INTRAMUSCULAR | Status: DC | PRN
  Administered 2016-11-25: 10 mg via INTRAVENOUS

## 2016-11-25 MED ORDER — FENTANYL CITRATE (PF) 100 MCG/2ML IJ SOLN
INTRAMUSCULAR | Status: DC | PRN
  Administered 2016-11-25 (×2): 50 ug via INTRAVENOUS

## 2016-11-25 MED ORDER — CEFAZOLIN SODIUM-DEXTROSE 2-4 GM/100ML-% IV SOLN
INTRAVENOUS | Status: AC
  Filled 2016-11-25: qty 100

## 2016-11-25 MED ORDER — 0.9 % SODIUM CHLORIDE (POUR BTL) OPTIME
TOPICAL | Status: DC | PRN
  Administered 2016-11-25: 1000 mL

## 2016-11-25 MED ORDER — HYDROMORPHONE HCL 1 MG/ML IJ SOLN
INTRAMUSCULAR | Status: AC
  Filled 2016-11-25: qty 0.5

## 2016-11-25 MED ORDER — KETOROLAC TROMETHAMINE 30 MG/ML IJ SOLN
30.0000 mg | Freq: Four times a day (QID) | INTRAMUSCULAR | Status: AC
  Administered 2016-11-25 – 2016-11-27 (×6): 30 mg via INTRAVENOUS
  Filled 2016-11-25 (×6): qty 1

## 2016-11-25 MED ORDER — OXYCODONE HCL 5 MG PO TABS
5.0000 mg | ORAL_TABLET | ORAL | Status: DC | PRN
  Administered 2016-11-25 – 2016-11-26 (×4): 10 mg via ORAL
  Administered 2016-11-26: 5 mg via ORAL
  Administered 2016-11-28: 10 mg via ORAL
  Filled 2016-11-25: qty 2
  Filled 2016-11-25: qty 1
  Filled 2016-11-25 (×5): qty 2

## 2016-11-25 MED ORDER — ONDANSETRON HCL 4 MG/2ML IJ SOLN
INTRAMUSCULAR | Status: DC | PRN
  Administered 2016-11-25: 4 mg via INTRAVENOUS

## 2016-11-25 MED ORDER — SUGAMMADEX SODIUM 500 MG/5ML IV SOLN
INTRAVENOUS | Status: AC
  Filled 2016-11-25: qty 5

## 2016-11-25 MED ORDER — HYDROMORPHONE HCL 1 MG/ML IJ SOLN
INTRAMUSCULAR | Status: AC
  Administered 2016-11-25: 0.25 mg via INTRAVENOUS
  Filled 2016-11-25: qty 0.5

## 2016-11-25 MED ORDER — KCL IN DEXTROSE-NACL 20-5-0.45 MEQ/L-%-% IV SOLN
INTRAVENOUS | Status: DC
  Administered 2016-11-25 – 2016-11-26 (×2): via INTRAVENOUS
  Administered 2016-11-26: 1000 mL via INTRAVENOUS
  Administered 2016-11-27: 04:00:00 via INTRAVENOUS
  Filled 2016-11-25 (×6): qty 1000

## 2016-11-25 MED ORDER — MIDAZOLAM HCL 2 MG/2ML IJ SOLN
INTRAMUSCULAR | Status: AC
  Filled 2016-11-25: qty 2

## 2016-11-25 MED ORDER — ONDANSETRON HCL 4 MG/2ML IJ SOLN
INTRAMUSCULAR | Status: AC
  Filled 2016-11-25: qty 2

## 2016-11-25 MED ORDER — CEFAZOLIN SODIUM-DEXTROSE 2-4 GM/100ML-% IV SOLN
2.0000 g | Freq: Three times a day (TID) | INTRAVENOUS | Status: AC
  Administered 2016-11-25: 2 g via INTRAVENOUS
  Filled 2016-11-25: qty 100

## 2016-11-25 MED ORDER — HEPARIN SODIUM (PORCINE) 5000 UNIT/ML IJ SOLN
5000.0000 [IU] | Freq: Three times a day (TID) | INTRAMUSCULAR | Status: DC
  Administered 2016-11-26 – 2016-11-28 (×7): 5000 [IU] via SUBCUTANEOUS
  Filled 2016-11-25 (×7): qty 1

## 2016-11-25 MED ORDER — HYDROMORPHONE HCL 1 MG/ML IJ SOLN
INTRAMUSCULAR | Status: AC
  Administered 2016-11-25: 0.5 mg via INTRAVENOUS
  Filled 2016-11-25: qty 0.5

## 2016-11-25 MED ORDER — ROCURONIUM BROMIDE 50 MG/5ML IV SOSY
PREFILLED_SYRINGE | INTRAVENOUS | Status: AC
  Filled 2016-11-25: qty 5

## 2016-11-25 MED ORDER — EPHEDRINE SULFATE 50 MG/ML IJ SOLN
INTRAMUSCULAR | Status: DC | PRN
  Administered 2016-11-25: 10 mg via INTRAVENOUS

## 2016-11-25 MED ORDER — ONDANSETRON 4 MG PO TBDP: 4.0000 mg | ORAL_TABLET | Freq: Four times a day (QID) | ORAL | Status: DC | PRN

## 2016-11-25 MED ORDER — KETOROLAC TROMETHAMINE 30 MG/ML IJ SOLN
INTRAMUSCULAR | Status: DC | PRN
  Administered 2016-11-25: 30 mg via INTRAVENOUS

## 2016-11-25 MED ORDER — AMLODIPINE BESYLATE 10 MG PO TABS
10.0000 mg | ORAL_TABLET | Freq: Every day | ORAL | Status: DC
  Administered 2016-11-26 – 2016-11-28 (×3): 10 mg via ORAL
  Filled 2016-11-25 (×5): qty 1

## 2016-11-25 MED ORDER — PROPOFOL 10 MG/ML IV BOLUS
INTRAVENOUS | Status: AC
  Filled 2016-11-25: qty 20

## 2016-11-25 MED ORDER — ONDANSETRON HCL 4 MG/2ML IJ SOLN
4.0000 mg | INTRAMUSCULAR | Status: DC | PRN
  Administered 2016-11-26 – 2016-11-27 (×5): 4 mg via INTRAVENOUS
  Filled 2016-11-25 (×5): qty 2

## 2016-11-25 MED ORDER — HYDROMORPHONE HCL 1 MG/ML IJ SOLN
0.2500 mg | INTRAMUSCULAR | Status: DC | PRN
  Administered 2016-11-25: 0.25 mg via INTRAVENOUS
  Administered 2016-11-25 (×2): 0.5 mg via INTRAVENOUS
  Administered 2016-11-25: 0.25 mg via INTRAVENOUS
  Administered 2016-11-25: 0.5 mg via INTRAVENOUS
  Administered 2016-11-25 (×2): 0.25 mg via INTRAVENOUS

## 2016-11-25 MED ORDER — BUPIVACAINE HCL (PF) 0.25 % IJ SOLN
INTRAMUSCULAR | Status: DC | PRN
  Administered 2016-11-25: 30 mL

## 2016-11-25 MED ORDER — ROCURONIUM BROMIDE 10 MG/ML (PF) SYRINGE
PREFILLED_SYRINGE | INTRAVENOUS | Status: DC | PRN
  Administered 2016-11-25: 50 mg via INTRAVENOUS
  Administered 2016-11-25: 10 mg via INTRAVENOUS

## 2016-11-25 MED ORDER — LACTATED RINGERS IV SOLN
INTRAVENOUS | Status: DC | PRN
  Administered 2016-11-25 (×2): via INTRAVENOUS

## 2016-11-25 MED ORDER — METHOCARBAMOL 500 MG PO TABS
500.0000 mg | ORAL_TABLET | Freq: Four times a day (QID) | ORAL | Status: DC
  Administered 2016-11-25 – 2016-11-28 (×11): 500 mg via ORAL
  Filled 2016-11-25 (×11): qty 1

## 2016-11-25 MED ORDER — MORPHINE SULFATE (PF) 2 MG/ML IV SOLN
2.0000 mg | INTRAVENOUS | Status: DC | PRN
  Administered 2016-11-25 – 2016-11-26 (×3): 4 mg via INTRAVENOUS
  Administered 2016-11-27: 2 mg via INTRAVENOUS
  Filled 2016-11-25: qty 2
  Filled 2016-11-25: qty 1
  Filled 2016-11-25 (×2): qty 2

## 2016-11-25 MED ORDER — DEXAMETHASONE SODIUM PHOSPHATE 10 MG/ML IJ SOLN
INTRAMUSCULAR | Status: AC
  Filled 2016-11-25: qty 1

## 2016-11-25 MED ORDER — FENTANYL CITRATE (PF) 100 MCG/2ML IJ SOLN
INTRAMUSCULAR | Status: AC
  Filled 2016-11-25: qty 2

## 2016-11-25 MED ORDER — CEFAZOLIN SODIUM-DEXTROSE 2-4 GM/100ML-% IV SOLN
2.0000 g | INTRAVENOUS | Status: AC
  Administered 2016-11-25: 2 g via INTRAVENOUS

## 2016-11-25 MED ORDER — MIDAZOLAM HCL 5 MG/5ML IJ SOLN
INTRAMUSCULAR | Status: DC | PRN
  Administered 2016-11-25: 2 mg via INTRAVENOUS

## 2016-11-25 MED ORDER — BUPIVACAINE HCL (PF) 0.25 % IJ SOLN
INTRAMUSCULAR | Status: AC
Start: 2016-11-25 — End: 2016-11-25
  Filled 2016-11-25: qty 30

## 2016-11-25 MED ORDER — LIDOCAINE 2% (20 MG/ML) 5 ML SYRINGE
INTRAMUSCULAR | Status: DC | PRN
  Administered 2016-11-25: 100 mg via INTRAVENOUS

## 2016-11-25 MED ORDER — PROPOFOL 10 MG/ML IV BOLUS
INTRAVENOUS | Status: DC | PRN
  Administered 2016-11-25: 200 mg via INTRAVENOUS

## 2016-11-25 MED ORDER — LIDOCAINE 2% (20 MG/ML) 5 ML SYRINGE
INTRAMUSCULAR | Status: AC
  Filled 2016-11-25: qty 5

## 2016-11-25 MED ORDER — SUCCINYLCHOLINE CHLORIDE 200 MG/10ML IV SOSY
PREFILLED_SYRINGE | INTRAVENOUS | Status: DC | PRN
  Administered 2016-11-25: 20 mg via INTRAVENOUS

## 2016-11-25 MED ORDER — SUCCINYLCHOLINE CHLORIDE 200 MG/10ML IV SOSY
PREFILLED_SYRINGE | INTRAVENOUS | Status: AC
  Filled 2016-11-25: qty 10

## 2016-11-25 MED ORDER — LACTATED RINGERS IR SOLN
Status: DC | PRN
  Administered 2016-11-25: 3000 mL

## 2016-11-25 SURGICAL SUPPLY — 47 items
BANDAGE ADH SHEER 1  50/CT (GAUZE/BANDAGES/DRESSINGS) ×14 IMPLANT
BENZOIN TINCTURE PRP APPL 2/3 (GAUZE/BANDAGES/DRESSINGS) ×2 IMPLANT
BINDER ABDOMINAL 12 ML 46-62 (SOFTGOODS) ×2 IMPLANT
CABLE HIGH FREQUENCY MONO STRZ (ELECTRODE) ×2 IMPLANT
CHLORAPREP W/TINT 26ML (MISCELLANEOUS) ×2 IMPLANT
COVER SURGICAL LIGHT HANDLE (MISCELLANEOUS) ×2 IMPLANT
DECANTER SPIKE VIAL GLASS SM (MISCELLANEOUS) ×2 IMPLANT
DEVICE TROCAR PUNCTURE CLOSURE (ENDOMECHANICALS) ×2 IMPLANT
DISSECTOR BLUNT TIP ENDO 5MM (MISCELLANEOUS) ×2 IMPLANT
DRAPE INCISE IOBAN 66X45 STRL (DRAPES) ×2 IMPLANT
DRAPE UTILITY XL STRL (DRAPES) ×2 IMPLANT
DRSG TEGADERM 2-3/8X2-3/4 SM (GAUZE/BANDAGES/DRESSINGS) ×14 IMPLANT
ELECT REM PT RETURN 9FT ADLT (ELECTROSURGICAL) ×2
ELECTRODE REM PT RTRN 9FT ADLT (ELECTROSURGICAL) ×1 IMPLANT
GAUZE SPONGE 2X2 8PLY STRL LF (GAUZE/BANDAGES/DRESSINGS) ×1 IMPLANT
GLOVE ECLIPSE 8.0 STRL XLNG CF (GLOVE) ×2 IMPLANT
GLOVE INDICATOR 8.0 STRL GRN (GLOVE) ×4 IMPLANT
GOWN STRL REUS W/TWL XL LVL3 (GOWN DISPOSABLE) ×4 IMPLANT
IRRIG SUCT STRYKERFLOW 2 WTIP (MISCELLANEOUS)
IRRIGATION SUCT STRKRFLW 2 WTP (MISCELLANEOUS) IMPLANT
KIT BASIN OR (CUSTOM PROCEDURE TRAY) ×2 IMPLANT
MARKER SKIN DUAL TIP RULER LAB (MISCELLANEOUS) ×2 IMPLANT
MESH VENTRALIGHT ST 10X13IN (Mesh General) ×2 IMPLANT
NEEDLE SPNL 22GX3.5 QUINCKE BK (NEEDLE) ×2 IMPLANT
PAD POSITIONING PINK XL (MISCELLANEOUS) IMPLANT
POSITIONER SURGICAL ARM (MISCELLANEOUS) IMPLANT
SCISSORS LAP 5X35 DISP (ENDOMECHANICALS) ×2 IMPLANT
SET IRRIG TUBING LAPAROSCOPIC (IRRIGATION / IRRIGATOR) ×2 IMPLANT
SHEARS HARMONIC ACE PLUS 36CM (ENDOMECHANICALS) ×2 IMPLANT
SLEEVE XCEL OPT CAN 5 100 (ENDOMECHANICALS) ×4 IMPLANT
SOLUTION ANTI FOG 6CC (MISCELLANEOUS) ×2 IMPLANT
SPONGE GAUZE 2X2 STER 10/PKG (GAUZE/BANDAGES/DRESSINGS) ×1
STRIP CLOSURE SKIN 1/2X4 (GAUZE/BANDAGES/DRESSINGS) ×2 IMPLANT
SUT MNCRL AB 4-0 PS2 18 (SUTURE) ×4 IMPLANT
SUT NOVA NAB DX-16 0-1 5-0 T12 (SUTURE) ×4 IMPLANT
SUT PROLENE 0 CT 1 CR/8 (SUTURE) IMPLANT
TACKER 5MM HERNIA 3.5CML NAB (ENDOMECHANICALS) ×4 IMPLANT
TAPE CLOTH 4X10 WHT NS (GAUZE/BANDAGES/DRESSINGS) IMPLANT
TOWEL OR 17X26 10 PK STRL BLUE (TOWEL DISPOSABLE) ×2 IMPLANT
TRAY FOLEY W/METER SILVER 14FR (SET/KITS/TRAYS/PACK) ×2 IMPLANT
TRAY FOLEY W/METER SILVER 16FR (SET/KITS/TRAYS/PACK) IMPLANT
TRAY LAPAROSCOPIC (CUSTOM PROCEDURE TRAY) ×2 IMPLANT
TROCAR BLADELESS OPT 5 100 (ENDOMECHANICALS) ×2 IMPLANT
TROCAR XCEL 12X100 BLDLESS (ENDOMECHANICALS) ×2 IMPLANT
TROCAR XCEL BLUNT TIP 100MML (ENDOMECHANICALS) IMPLANT
TROCAR XCEL NON-BLD 11X100MML (ENDOMECHANICALS) ×2 IMPLANT
TUBING INSUF HEATED (TUBING) ×2 IMPLANT

## 2016-11-25 NOTE — Transfer of Care (Signed)
Immediate Anesthesia Transfer of Care Note  Patient: Lisa Flores  Procedure(s) Performed: Procedure(s): LAPAROSCOPIC REPAIR OF RECURRENT VENTRAL HERNIAS WITH MESH (N/A) INSERTION OF MESH VERTRALIGHT ST 25.4cm X 33cm (N/A)  Patient Location: PACU  Anesthesia Type:General  Level of Consciousness: sedated  Airway & Oxygen Therapy: Patient Spontanous Breathing and Patient connected to face mask oxygen  Post-op Assessment: Report given to RN and Post -op Vital signs reviewed and stable  Post vital signs: Reviewed and stable  Last Vitals:  Vitals:   11/25/16 0544  BP: (!) 146/67  Pulse: 80  Resp: 16  Temp: 36.7 C    Last Pain:  Vitals:   11/25/16 0544  TempSrc: Oral      Patients Stated Pain Goal: 4 ([id number])  Complications: No apparent anesthesia complications

## 2016-11-25 NOTE — H&P (View-Only) (Signed)
Naturelle Flores 10/30/2016 10:01 AM Location: [facility] Patient #: [id number] DOB: 1961-12-06 Married / Language: Lisa Flores / Race: White Female   History of Present Illness Odis Hollingshead MD; 10/30/2016 10:33 AM) The patient is a 55 year old female.  Note:She presents today Complaining of some periumbilical swelling and pain that began about 2 months ago. Sometimes the pain is quite severe depending on which way she moves or sits. This week it has not been as bad. Of note is that she underwent exploratory laparotomy, small bowel resection, and primary repair of an incisional hernia March 02, 2015 at the same time she was having a hysterectomy. We did not use a mesh at that time because of the clean contaminated nature of the case. She was aware that her risk of recurrence was higher. She has no symptoms of obstruction. No difficulty with voiding or bowel movements.  Allergies (April Staton, CMA; 10/30/2016 10:02 AM) No Known Drug Allergies02/12/2014  Medication History (April Staton, CMA; 10/30/2016 10:03 AM) Valsartan (160MG  Tablet, Oral) Active. AmLODIPine Besylate (10MG  Tablet, Oral) Active. Medications Reconciled    Review of Systems Odis Hollingshead MD; 10/30/2016 10:33 AM)  Note: No nausea or vomiting. No constipation or difficulty with urination.   Vitals (April Staton CMA; 10/30/2016 10:04 AM) 10/30/2016 10:03 AM Weight: 233 lb Height: 56in Height was reported by patient. Body Surface Area: 1.89 m Body Mass Index: 52.24 kg/m  Temp.: 98.92F(Oral)  Pulse: 70 (Regular)  P.OX: 97% (Room air) BP: 140/80 (Sitting, Left Arm, Standard)       Physical Exam Odis Hollingshead MD; 10/30/2016 10:35 AM) The physical exam findings are as follows: Note:General: Morbidly obese female in NAD. Pleasant and cooperative.  HEENT: /AT, no external nasal or ear masses, mucous membranes are moist  EYES: EOMI, no scleral icterus, pupils  normal  NECK: Supple, no obvious mass or thyroid mass/enlargement, no trachea deviation  CV: RRR, no murmur, no edema  CHEST: Breath sounds equal and clear. Respirations nonlabored.  ABDOMEN: Soft, nontender, nondistended, significantly obese, lower midline scar with umbilical bulge that is only partially reducible.  MUSCULOSKELETAL: FROM, good muscle tone, no edema, no venous stasis changes, normal station and gait  SKIN: No jaundice.  NEUROLOGIC: Alert and oriented, answers questions appropriately, normal gait and station.  PSYCHIATRIC: Normal mood, affect , and behavior.    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 The hernia is symptomatic. I recommended laparoscopic repair with mesh and she is interested in that.  Plan: Laparoscopic repair of recurrent ventral hernia with mesh. I have discussed the procedure, risks, and aftercare. Risks include but are not limited to bleeding, infection, wound healing problems, anesthesia, recurrence, accidental injury to intra-abdominal organs-such as intestine, liver, spleen, bladder, etc. We also discussed the rare complication of mesh rejection. All questions were answered.  Jackolyn Confer, M.D.

## 2016-11-25 NOTE — Interval H&P Note (Signed)
History and Physical Interval Note:  11/25/2016 7:28 AM  Lisa Flores  has presented today for surgery, with the diagnosis of RECURRENT VENTRAL HERNIA  The various methods of treatment have been discussed with the patient and family. After consideration of risks, benefits and other options for treatment, the patient has consented to  Procedure(s): [place] (N/A) INSERTION OF MESH (N/A) as a surgical intervention .  The patient's history has been reviewed, patient examined, no change in status, stable for surgery.  I have reviewed the patient's chart and labs.  Questions were answered to the patient's satisfaction.     Riyaan Heroux Lenna Sciara

## 2016-11-25 NOTE — Discharge Instructions (Addendum)
[facility] _______[facility]   HERNIA REPAIR: POST OP INSTRUCTIONS  Always review your discharge instruction sheet given to you by the facility where your surgery was performed. IF YOU HAVE DISABILITY OR FAMILY LEAVE FORMS, YOU MUST BRING THEM TO THE OFFICE FOR PROCESSING.   DO NOT GIVE THEM TO YOUR DOCTOR.  1. A  prescription for pain medication may be given to you upon discharge.  Take your pain medication as prescribed, if needed.  If narcotic pain medicine is not needed, then you may take acetaminophen (Tylenol) or ibuprofen (Advil) as needed. 2. Take your usually prescribed medications unless otherwise directed. 3. If you need a refill on your pain medication, please contact your pharmacy.  They will contact our office to request authorization. Prescriptions will not be filled after 5 pm or on week-ends. 4. You should follow a light diet the first 24 hours after arrival home, such as soup and crackers, etc.  Be sure to include lots of fluids daily.  Resume your normal diet the day after surgery. 5. Most patients will experience some swelling and bruising around the umbilicus or in the hernia repair site.  Ice packs and reclining will help.  Swelling and bruising can take several days to resolve.  6. It is common to experience some constipation if taking pain medication after surgery.  Increasing fluid intake and taking a stool softener (such as Colace) will usually help or prevent this problem from occurring.  A mild laxative (Milk of Magnesia or Miralax) should be taken according to package directions if there are no bowel movements after 48 hours. 7. Unless discharge instructions indicate otherwise, you may remove your bandages 72 hours after surgery.  You may shower the day after surgery.  You may have steri-strips (small skin tapes) in place directly over the incision.  These strips should be left on the skin until they fall off.  If your surgeon used skin glue on the incision, you  may shower in 24 hours.  The glue will flake off over the next 2-3 weeks.  Any sutures or staples will be removed at the office during your follow-up visit. 8. ACTIVITIES:  You may resume light daily activities beginning the next day--such as daily self-care, walking, climbing stairs--gradually increasing activities as tolerated. Do not lie flat for the first 2-3 days. You may have sexual intercourse when it is comfortable.  Refrain from any heavy lifting or straining-nothing over 10 pounds for 6 weeks.   a. You may drive when you are no longer taking prescription pain medication, you can comfortably wear a seatbelt, and you can safely maneuver your car and apply brakes. b. RETURN TO WORK:  _When released by M.D._________________________________________________________ 9. You should see your doctor in the office for a follow-up appointment approximately 2-3 weeks after your surgery.  Make sure that you call for this appointment within a day or two after you arrive home to insure a convenient appointment time. 10. OTHER INSTRUCTIONS:  __I suggest renting a lift chair or recliner.________________________________________________________________________________________________________________________________________________________________________________________  WHEN TO CALL YOUR DOCTOR: 1. Fever over 101.0 2. Inability to urinate 3. Nausea and/or vomiting 4. Extreme swelling or bruising 5. Continued bleeding from incision. 6. Increased pain, redness, or drainage from the incision  The clinic staff is available to answer your questions during regular business hours.  Please dont hesitate to call and ask to speak to one of the nurses for clinical concerns.  If you have a medical emergency, go to the nearest emergency  room or call 911.  A surgeon from [facility] is always on call at the hospital   [address] [place], [place], [place]  [address] ?  [address], [place],  [place]   [address] [phone] ? [phone] ? FAX [phone] Web site: [email or web address]

## 2016-11-25 NOTE — Op Note (Signed)
Operative Note  YULITZA AFZALI female 55 y.o. 11/25/2016  PREOPERATIVE DX:  Recurrent ventral hernia  POSTOPERATIVE DX:  Recurrent ventral hernias  PROCEDURE:   Laparoscopic repair of recurrent incarcerated (omentum)ventral hernias with mesh [place])         Surgeon: Odis Hollingshead   Assistants: Nedra Hai, M.D.  Anesthesia: General endotracheal anesthesia  Indications:   This is a 55 year old female who underwent a hysterectomy and had a primary repair of ventral hernia at that time. She is morbidly obese with a BMI of over 50. She presents now because she has recurrence in the periumbilical area that causes her intermittent severe pain.  She now presents for repair.    Procedure Detail:  She was brought to the operating room, placed supine on the operating table, and a general anesthetic was given. A Foley catheter was inserted. An orogastric tube was inserted.  The abdominal wall was widely sterilely prepped and draped. A timeout was performed.  She was placed in slight reverse Trendelenburg position. A 5 mm incision was made in the left subcostal area. Using a 5 mm Optiview trocar and laparoscope, access was gained into the peritoneal cavity. A pneumoperitoneum was created by insufflation of carbon dioxide gas. The laparoscope was introduced and there is no evidence of organ injury or bleeding under the trocar. The periumbilical hernia was identified containing incarcerated omentum that was viable.   A 5 mm trocar was placed in the left lower quadrant.  An 11 mm trocar was placed in the right upper quadrant. A 5 mm trocar was placed in the right mid lateral abdomen.  Using blunt dissection and the Harmonic scalpel the omentum was reduced from the hernia. Omental adhesions to the abdominal wall were mobilized using the harmonic scalpel and blunt dissection. As the dissection headed inferiorly, a second hernia was noted in the suprapubic region containing incarcerated  omentum. This was also labeled the reduced and the margins of both hernias identified. Using blunt dissection and the Harmonic scalpel, the abdominal wall was cleared of adhesions for approximate 5 cm around both hernias. The peripheries of the hernia was marked and I measured 4 cm away from this. A piece of Ventralite mesh was brought into the field. It was cut into a 20 cm long by 20 cm wide piece which would allow for good coverage of the hernia with adequate overlap. The rough side was marked. 8 anchoring sutures of #1 Novofil were placed around the mesh.  The mesh was hydrated. The 11 mm right upper quadrant trocar was replaced with a 12 mm trocar. The mesh was then placed into the peritoneal cavity through the 12 mm trocar. It was deployed with the nonadherent side facing the viscera. 8 stab wounds were placed in the abdominal wall to coincide with the anchoring sutures. All anchoring sutures were then brought up across the fascial bridge using a suture passer and then they were tied down anchoring the mesh to the abdominal wall. The mesh was then further anchored to the abdominal wall with an outer circular rim and inner circular rim of spiral tacks. This provided for adequate coverage and good overlap of the hernia defect.  A four-quadrant and central inspection was then performed. There was no evidence of bleeding or organ injury. The 12 mm trocar was removed and the fascial defect was closed using 0 Vicryl suture. The pneumoperitoneum was released and the remaining trocars were removed.  All skin incisions were closed with 4-0 Monocryl subcuticular stitches followed  by Steri-Strips and sterile dressings.  She tolerated the procedure well, without any apparent complications, and was taken to the recovery room in satisfactory condition.  Estimated Blood Loss:  100 ml         Drains: none        Complications:  * No complications entered in OR log *         Disposition: PACU - hemodynamically  stable.         Condition: stable

## 2016-11-25 NOTE — Anesthesia Procedure Notes (Signed)
Procedure Name: Intubation Date/Time: 11/25/2016 7:37 AM Performed by: Lind Covert Pre-anesthesia Checklist: Patient identified, Emergency Drugs available, Suction available, Patient being monitored and Timeout performed Patient Re-evaluated:Patient Re-evaluated prior to inductionOxygen Delivery Method: Circle system utilized Preoxygenation: Pre-oxygenation with 100% oxygen Intubation Type: IV induction Laryngoscope Size: Mac and 3 Grade View: Grade II Tube size: 7.0 mm Number of attempts: 2 (Able to see base of cords, esophageal intubation x1 intubation on 2nd attempt without difficulty) Airway Equipment and Method: Stylet Placement Confirmation: ETT inserted through vocal cords under direct vision,  positive ETCO2 and breath sounds checked- equal and bilateral Secured at: 21 cm Tube secured with: Tape Dental Injury: Teeth and Oropharynx as per pre-operative assessment

## 2016-11-25 NOTE — Anesthesia Postprocedure Evaluation (Signed)
Anesthesia Post Note  Patient: Lisa Flores  Procedure(s) Performed: Procedure(s) (LRB): LAPAROSCOPIC REPAIR OF RECURRENT VENTRAL HERNIAS WITH MESH (N/A) INSERTION OF MESH VERTRALIGHT ST 25.4cm X 33cm (N/A)  Patient location during evaluation: PACU Anesthesia Type: General Level of consciousness: awake and alert Pain management: pain level controlled Vital Signs Assessment: post-procedure vital signs reviewed and stable Respiratory status: spontaneous breathing, nonlabored ventilation, respiratory function stable and patient connected to nasal cannula oxygen Cardiovascular status: blood pressure returned to baseline and stable Postop Assessment: no signs of nausea or vomiting Anesthetic complications: no    Last Vitals:  Vitals:   11/25/16 1045 11/25/16 1100  BP: (!) 118/55   Pulse: 76   Resp: 14   Temp:  37 C    Last Pain:  Vitals:   11/25/16 0544  TempSrc: Oral                 Shirly Bartosiewicz,W. EDMOND

## 2016-11-26 ENCOUNTER — Encounter (HOSPITAL_COMMUNITY): Payer: Self-pay | Admitting: General Surgery

## 2016-11-26 DIAGNOSIS — I10 Essential (primary) hypertension: Secondary | ICD-10-CM | POA: Diagnosis present

## 2016-11-26 DIAGNOSIS — K430 Incisional hernia with obstruction, without gangrene: Secondary | ICD-10-CM | POA: Diagnosis present

## 2016-11-26 DIAGNOSIS — Z6841 Body Mass Index (BMI) 40.0 and over, adult: Secondary | ICD-10-CM | POA: Diagnosis not present

## 2016-11-26 DIAGNOSIS — Z90710 Acquired absence of both cervix and uterus: Secondary | ICD-10-CM | POA: Diagnosis not present

## 2016-11-26 DIAGNOSIS — R110 Nausea: Secondary | ICD-10-CM | POA: Diagnosis not present

## 2016-11-26 NOTE — Progress Notes (Signed)
Assessment Principal Problem:   Recurrent ventral hernias s/p laparoscopic repair with mesh 11/25/16-nausea this AM; not ready for discharge. Active Problems:   BMI 45.0-49.9, adult (HCC)   Plan:  Keep on liquid diet until nausea improves.  Ambulate.   LOS: 0 days     1 Day Post-Op  Subjective: Fair pain control.  Having nausea.  Objective: Vital signs in last 24 hours: Temp:  [97.4 F (36.3 C)-98.7 F (37.1 C)] 97.8 F (36.6 C) (12/12 0545) Pulse Rate:  [66-80] 66 (12/12 0545) Resp:  [12-20] 18 (12/12 0545) BP: (98-142)/(41-88) 134/54 (12/12 0545) SpO2:  [90 %-100 %] 97 % (12/12 0545) Last BM Date:  (prior to admission)  Intake/Output from previous day: 12/11 0701 - 12/12 0700 In: 3780 [P.O.:360; I.V.:3420] Out: 2610 [Urine:2590; Blood:20] Intake/Output this shift: No intake/output data recorded.  PE: General- In NAD Abdomen-soft, binder on  Lab Results:  No results for input(s): WBC, HGB, HCT, PLT in the last 72 hours. BMET No results for input(s): NA, K, CL, CO2, GLUCOSE, BUN, CREATININE, CALCIUM in the last 72 hours. PT/INR No results for input(s): LABPROT, INR in the last 72 hours. Comprehensive Metabolic Panel:    Component Value Date/Time   NA 140 11/22/2016 1030   NA 133 (L) 03/08/2015 0453   K 4.2 11/22/2016 1030   K 3.8 03/08/2015 0453   CL 106 11/22/2016 1030   CL 102 03/08/2015 0453   CO2 26 11/22/2016 1030   CO2 24 03/08/2015 0453   BUN 29 (H) 11/22/2016 1030   BUN 12 03/08/2015 0453   CREATININE 0.78 11/22/2016 1030   CREATININE 0.53 03/08/2015 0453   CREATININE 0.51 08/26/2014 1042   CREATININE 0.77 06/13/2014 1708   GLUCOSE 98 11/22/2016 1030   GLUCOSE 99 03/08/2015 0453   CALCIUM 9.1 11/22/2016 1030   CALCIUM 8.0 (L) 03/08/2015 0453   AST 21 11/22/2016 1030   AST 17 02/24/2015 1030   ALT 16 11/22/2016 1030   ALT 14 02/24/2015 1030   ALKPHOS 64 11/22/2016 1030   ALKPHOS 57 02/24/2015 1030   BILITOT 0.7 11/22/2016 1030   BILITOT  0.9 02/24/2015 1030   PROT 7.2 11/22/2016 1030   PROT 7.2 02/24/2015 1030   ALBUMIN 4.0 11/22/2016 1030   ALBUMIN 4.0 02/24/2015 1030     Studies/Results: No results found.  Anti-infectives: Anti-infectives    Start     Dose/Rate Route Frequency Ordered Stop   11/25/16 1600  ceFAZolin (ANCEF) IVPB 2g/100 mL premix     2 g 200 mL/hr over 30 Minutes Intravenous Every 8 hours 11/25/16 1209 11/25/16 1730   11/25/16 0559  ceFAZolin (ANCEF) IVPB 2g/100 mL premix     2 g 200 mL/hr over 30 Minutes Intravenous On call to O.R. 11/25/16 0559 11/25/16 0740       Lisa Flores J 11/26/2016

## 2016-11-27 MED ORDER — HYDROMORPHONE HCL 2 MG/ML IJ SOLN: 0.5000 mg | INTRAMUSCULAR | Status: DC | PRN

## 2016-11-27 MED ORDER — KETOROLAC TROMETHAMINE 30 MG/ML IJ SOLN
30.0000 mg | Freq: Four times a day (QID) | INTRAMUSCULAR | Status: DC
  Administered 2016-11-27 – 2016-11-28 (×5): 30 mg via INTRAVENOUS
  Filled 2016-11-27 (×5): qty 1

## 2016-11-27 MED ORDER — METOCLOPRAMIDE HCL 5 MG/ML IJ SOLN
10.0000 mg | Freq: Four times a day (QID) | INTRAMUSCULAR | Status: DC
  Administered 2016-11-27 – 2016-11-28 (×5): 10 mg via INTRAVENOUS
  Filled 2016-11-27 (×5): qty 2

## 2016-11-27 NOTE — Progress Notes (Signed)
Assessment Principal Problem:   Recurrent ventral hernias s/p laparoscopic repair with mesh 11/25/16-continued problems with pain control and nausea Active Problems:   BMI 45.0-49.9, adult ([place])   Plan: Change to Dilaudid.  Continue Toradol.  Add Reglan for nausea.  Keep on liquid diet until nausea improves.    LOS: 1 day     2 Days Post-Op  Subjective: Pain control not adequate.  About the same amount of pain as yesterday.  Persistent nausea.  Passing gas.  Objective: Vital signs in last 24 hours: Temp:  [97.7 F (36.5 C)-98.5 F (36.9 C)] 98.5 F (36.9 C) (12/13 0539) Pulse Rate:  [64-82] 82 (12/13 0539) Resp:  [18-20] 18 (12/13 0539) BP: (108-145)/(51-77) 145/77 (12/13 0539) SpO2:  [96 %-98 %] 96 % (12/13 0539) Last BM Date: 11/25/16  Intake/Output from previous day: 12/12 0701 - 12/13 0700 In: 1560 [P.O.:360; I.V.:1200] Out: 1925 [Urine:1925] Intake/Output this shift: No intake/output data recorded.  PE: General- In NAD Abdomen-soft, dressings dry, active bowel sounds  Lab Results:  No results for input(s): WBC, HGB, HCT, PLT in the last 72 hours. BMET No results for input(s): NA, K, CL, CO2, GLUCOSE, BUN, CREATININE, CALCIUM in the last 72 hours. PT/INR No results for input(s): LABPROT, INR in the last 72 hours. Comprehensive Metabolic Panel:    Component Value Date/Time   NA 140 11/22/2016 1030   NA 133 (L) 03/08/2015 0453   K 4.2 11/22/2016 1030   K 3.8 03/08/2015 0453   CL 106 11/22/2016 1030   CL 102 03/08/2015 0453   CO2 26 11/22/2016 1030   CO2 24 03/08/2015 0453   BUN 29 (H) 11/22/2016 1030   BUN 12 03/08/2015 0453   CREATININE 0.78 11/22/2016 1030   CREATININE 0.53 03/08/2015 0453   CREATININE 0.51 08/26/2014 1042   CREATININE 0.77 06/13/2014 1708   GLUCOSE 98 11/22/2016 1030   GLUCOSE 99 03/08/2015 0453   CALCIUM 9.1 11/22/2016 1030   CALCIUM 8.0 (L) 03/08/2015 0453   AST 21 11/22/2016 1030   AST 17 02/24/2015 1030   ALT 16 11/22/2016  1030   ALT 14 02/24/2015 1030   ALKPHOS 64 11/22/2016 1030   ALKPHOS 57 02/24/2015 1030   BILITOT 0.7 11/22/2016 1030   BILITOT 0.9 02/24/2015 1030   PROT 7.2 11/22/2016 1030   PROT 7.2 02/24/2015 1030   ALBUMIN 4.0 11/22/2016 1030   ALBUMIN 4.0 02/24/2015 1030     Studies/Results: No results found.  Anti-infectives: Anti-infectives    Start     Dose/Rate Route Frequency Ordered Stop   11/25/16 1600  ceFAZolin (ANCEF) IVPB 2g/100 mL premix     2 g 200 mL/hr over 30 Minutes Intravenous Every 8 hours 11/25/16 1209 11/25/16 1730   11/25/16 0559  ceFAZolin (ANCEF) IVPB 2g/100 mL premix     2 g 200 mL/hr over 30 Minutes Intravenous On call to O.R. 11/25/16 0559 11/25/16 0740       Lisa Flores J 11/27/2016

## 2016-11-28 MED ORDER — OXYCODONE HCL 5 MG PO TABS: 5.0000 mg | ORAL_TABLET | ORAL | 0 refills | Status: AC | PRN

## 2016-11-28 NOTE — Progress Notes (Signed)
Assessment Principal Problem:   Recurrent ventral hernias s/p laparoscopic repair with mesh 11/25/16-feeling better overall today  Active Problems:   BMI 45.0-49.9, adult ([place])   Plan: Discharge today.  Instructions given.   LOS: 2 days     3 Days Post-Op  Subjective: Feels better overall.  Nausea better.  Pain control better. Passing gas.  Objective: Vital signs in last 24 hours: Temp:  [98.2 F (36.8 C)-98.7 F (37.1 C)] 98.2 F (36.8 C) (12/14 0550) Pulse Rate:  [73-77] 74 (12/14 0550) Resp:  [18] 18 (12/14 0550) BP: (122-138)/(40-45) 122/40 (12/14 0550) SpO2:  [92 %-94 %] 92 % (12/14 0550) Last BM Date: 11/25/16  Intake/Output from previous day: 12/13 0701 - 12/14 0700 In: 1460 [P.O.:360; I.V.:1100] Out: 3650 [Urine:3650] Intake/Output this shift: No intake/output data recorded.  PE: General- In NAD Abdomen-soft, dressings dry  Lab Results:  No results for input(s): WBC, HGB, HCT, PLT in the last 72 hours. BMET No results for input(s): NA, K, CL, CO2, GLUCOSE, BUN, CREATININE, CALCIUM in the last 72 hours. PT/INR No results for input(s): LABPROT, INR in the last 72 hours. Comprehensive Metabolic Panel:    Component Value Date/Time   NA 140 11/22/2016 1030   NA 133 (L) 03/08/2015 0453   K 4.2 11/22/2016 1030   K 3.8 03/08/2015 0453   CL 106 11/22/2016 1030   CL 102 03/08/2015 0453   CO2 26 11/22/2016 1030   CO2 24 03/08/2015 0453   BUN 29 (H) 11/22/2016 1030   BUN 12 03/08/2015 0453   CREATININE 0.78 11/22/2016 1030   CREATININE 0.53 03/08/2015 0453   CREATININE 0.51 08/26/2014 1042   CREATININE 0.77 06/13/2014 1708   GLUCOSE 98 11/22/2016 1030   GLUCOSE 99 03/08/2015 0453   CALCIUM 9.1 11/22/2016 1030   CALCIUM 8.0 (L) 03/08/2015 0453   AST 21 11/22/2016 1030   AST 17 02/24/2015 1030   ALT 16 11/22/2016 1030   ALT 14 02/24/2015 1030   ALKPHOS 64 11/22/2016 1030   ALKPHOS 57 02/24/2015 1030   BILITOT 0.7 11/22/2016 1030   BILITOT 0.9  02/24/2015 1030   PROT 7.2 11/22/2016 1030   PROT 7.2 02/24/2015 1030   ALBUMIN 4.0 11/22/2016 1030   ALBUMIN 4.0 02/24/2015 1030     Studies/Results: No results found.  Anti-infectives: Anti-infectives    Start     Dose/Rate Route Frequency Ordered Stop   11/25/16 1600  ceFAZolin (ANCEF) IVPB 2g/100 mL premix     2 g 200 mL/hr over 30 Minutes Intravenous Every 8 hours 11/25/16 1209 11/25/16 1730   11/25/16 0559  ceFAZolin (ANCEF) IVPB 2g/100 mL premix     2 g 200 mL/hr over 30 Minutes Intravenous On call to O.R. 11/25/16 0559 11/25/16 0740       Brieann Osinski J 11/28/2016

## 2016-11-28 NOTE — Discharge Summary (Signed)
Physician Discharge Summary  Patient ID: Lisa Flores MRN: [id number] DOB/AGE: 1961/01/31 55 y.o.  Admit date: 11/25/2016 Discharge date: 11/28/2016  Admission Diagnoses:  Recurrent ventral hernia  Discharge Diagnoses:  Principal Problem:   Recurrent ventral hernias s/p laparoscopic repair with mesh 11/25/16 Active Problems:   BMI 45.0-49.9, adult [facility])   Discharged Condition: good  Hospital Course: She underwent the above procedure.  Initially postop she struggled with adequate pain control and nausea.  However, on POD #3 she was feeling much better and able to be discharged.   Discharge Exam: Blood pressure (!) 122/40, pulse 74, temperature 98.2 F (36.8 C), temperature source Oral, resp. rate 18, height 4\' 8"  (1.422 m), weight 106.1 kg (234 lb), last menstrual period 03/01/2015, SpO2 92 %.   Disposition: 01-Home or Self Care     Medication List    TAKE these medications   amLODipine 10 MG tablet Commonly known as:  NORVASC Take 10 mg by mouth daily.   aspirin EC 81 MG tablet Take 81 mg by mouth every other day.   buPROPion 150 MG 24 hr tablet Commonly known as:  WELLBUTRIN XL Take 150 mg by mouth daily.   diclofenac 75 MG EC tablet Commonly known as:  VOLTAREN Take 75 mg by mouth 2 (two) times daily as needed for pain. Scheduled in the morning   oxyCODONE 5 MG immediate release tablet Commonly known as:  Oxy IR/ROXICODONE Take 1-2 tablets (5-10 mg total) by mouth every 4 (four) hours as needed for moderate pain.   valsartan 160 MG tablet Commonly known as:  DIOVAN Take 160 mg by mouth daily.        Signed: Odis Hollingshead 11/28/2016, 7:44 AM

## 2024-05-17 ENCOUNTER — Emergency Department (HOSPITAL_BASED_OUTPATIENT_CLINIC_OR_DEPARTMENT_OTHER)
Admission: EM | Admit: 2024-05-17 | Discharge: 2024-05-17 | Disposition: A | Discharge status: Home / Self Care | Attending: Emergency Medicine | Admitting: Emergency Medicine

## 2024-05-17 ENCOUNTER — Other Ambulatory Visit: Payer: Self-pay

## 2024-05-17 ENCOUNTER — Encounter (HOSPITAL_BASED_OUTPATIENT_CLINIC_OR_DEPARTMENT_OTHER): Payer: Self-pay

## 2024-05-17 ENCOUNTER — Emergency Department (HOSPITAL_BASED_OUTPATIENT_CLINIC_OR_DEPARTMENT_OTHER)

## 2024-05-17 DIAGNOSIS — S6992XA Unspecified injury of left wrist, hand and finger(s), initial encounter: Secondary | ICD-10-CM | POA: Diagnosis present

## 2024-05-17 DIAGNOSIS — Z7982 Long term (current) use of aspirin: Secondary | ICD-10-CM | POA: Diagnosis not present

## 2024-05-17 DIAGNOSIS — S61012A Laceration without foreign body of left thumb without damage to nail, initial encounter: Secondary | ICD-10-CM | POA: Insufficient documentation

## 2024-05-17 DIAGNOSIS — Z79899 Other long term (current) drug therapy: Secondary | ICD-10-CM | POA: Diagnosis not present

## 2024-05-17 DIAGNOSIS — W268XXA Contact with other sharp object(s), not elsewhere classified, initial encounter: Secondary | ICD-10-CM | POA: Insufficient documentation

## 2024-05-17 MED ORDER — CEPHALEXIN 500 MG PO CAPS: 500.0000 mg | ORAL_CAPSULE | Freq: Four times a day (QID) | ORAL | 0 refills | Status: AC

## 2024-05-17 MED ORDER — LIDOCAINE HCL (PF) 1 % IJ SOLN
5.0000 mL | Freq: Once | INTRAMUSCULAR | Status: AC
  Administered 2024-05-17: 5 mL
  Filled 2024-05-17: qty 5

## 2024-05-17 NOTE — Discharge Instructions (Addendum)
 Laceration was repaired with 4 stitches.  These will need to be removed in 7 days.  Your primary care doctor cannot remove the so you can return to the emergency department.  I have sent antibiotic into the pharmacy.  Apply topical Neosporin over this as well.  Return for any emergent symptoms.  If he noticed any fever without any other source, drainage from this area, or worsening redness, or difficulty moving your thumb return for evaluation as these are signs of infection.

## 2024-05-17 NOTE — ED Triage Notes (Signed)
 Patient cut herself Saturday with as pair of scissors. She states "it just won't heal". Denies chills, or any purulent drainage. She does not know when her last tetanus shot was.There is no active bleeding and it is covered with coban and non adherent gauze.

## 2024-05-17 NOTE — ED Notes (Signed)
Discharge instructions, follow up care, and prescription reviewed and explained, pt verbalized understanding.  

## 2024-05-17 NOTE — ED Notes (Signed)
 Left thumb wrapped and dressed by Cortney, RN. Pt provided education on wound care/dressing care and verbalized understanding with no further questions.

## 2024-05-17 NOTE — ED Provider Notes (Addendum)
 [place] EMERGENCY DEPARTMENT AT [facility] Provider Note   CSN: [id number] Arrival date & time: 05/17/24  7829     History  Chief Complaint  Patient presents with   Laceration    Lisa Flores is a 63 y.o. female.  63 year old female presents from urgent care for concern of laceration to the posterior aspect of her left thumb.  She is right-hand dominant.  This occurred around 12 AM Sunday.  She states she was opening packaging and was cutting off a piece of zip tie when the scissors slipped striking her in the back of the hand.  She had significant bleeding at the time however bleeding is currently controlled.  She later showed it to her family and was instructed to come in.  She states the urgent care was unable to fix this due to how long you have been since the initial injury.  She is about 34 hours out from the initial injury.  She is unsure of her last tetanus but believes it is likely up-to-date because she recently got a new PCP in the past several years.  The history is provided by the patient. No language interpreter was used.       Home Medications Prior to Admission medications   Medication Sig Start Date End Date Taking? Authorizing Provider  buPROPion  (WELLBUTRIN  XL) 300 MG 24 hr tablet Take 300 mg by mouth daily. 05/19/23  Yes [provider]  losartan-hydrochlorothiazide (HYZAAR) 100-25 MG tablet Take 1 tablet by mouth daily. 05/19/23  Yes [provider]  amLODipine  (NORVASC ) 10 MG tablet Take 10 mg by mouth daily.    [provider]  aspirin EC 81 MG tablet Take 81 mg by mouth every other day.    [provider]  atorvastatin (LIPITOR) 20 MG tablet Take 20 mg by mouth daily.    [provider]  buPROPion  (WELLBUTRIN  XL) 150 MG 24 hr tablet Take 150 mg by mouth daily. 11/15/16   [provider]  diclofenac (VOLTAREN) 75 MG EC tablet Take 75 mg by mouth 2 (two) times daily as needed for pain. Scheduled in  the morning 11/11/16   [provider]  FLUoxetine  (PROZAC ) 20 MG capsule Take 20 mg by mouth daily.    [provider]  FLUoxetine  (PROZAC ) 40 MG capsule Take 40 mg by mouth daily.    [provider]  hydrALAZINE  (APRESOLINE ) 25 MG tablet Take 25 mg by mouth 3 (three) times daily.    [provider]  metoprolol succinate (TOPROL-XL) 25 MG 24 hr tablet Take 25 mg by mouth daily.    [provider]  oxyCODONE  (OXY IR/ROXICODONE ) 5 MG immediate release tablet Take 1-2 tablets (5-10 mg total) by mouth every 4 (four) hours as needed for moderate pain. 11/28/16   Adalberto Hollow, MD  valsartan (DIOVAN) 160 MG tablet Take 160 mg by mouth daily. 11/08/16   [provider]      Allergies    Patient has no known allergies.    Review of Systems   Review of Systems  Constitutional:  Negative for fever.  Skin:  Positive for wound.  All other systems reviewed and are negative.   Physical Exam Updated Vital Signs BP (!) 164/88 (BP Location: Left Arm)   Pulse 62   Temp 98.6 F (37 C) (Oral)   Resp 18   Ht 4\' 8"  (1.422 m)   Wt 104.8 kg   LMP 03/01/2015   SpO2 96%   BMI  51.79 kg/m  Physical Exam Vitals and nursing note reviewed.  Constitutional:      General: She is not in acute distress.    Appearance: Normal appearance. She is not ill-appearing.  HENT:     Head: Normocephalic and atraumatic.     Nose: Nose normal.  Eyes:     Conjunctiva/sclera: Conjunctivae normal.  Cardiovascular:     Rate and Rhythm: Normal rate.  Pulmonary:     Effort: Pulmonary effort is normal. No respiratory distress.  Abdominal:     Palpations: Abdomen is soft.  Musculoskeletal:        General: No deformity. Normal range of motion.     Comments: Neurovascularly intact.  There is about eight 4 cm wound without any active bleeding.  No underlying tissue seems to be involved.  Tendon and vascular system is intact.  Sensation is intact.  Brisk cap refill.   Skin:    Findings: No rash.  Neurological:     Mental Status: She is alert.     ED Results / Procedures / Treatments   Labs (all labs ordered are listed, but only abnormal results are displayed) Labs Reviewed - No data to display  EKG None  Radiology No results found.  Procedures .Aaron AasLac repair Kearia Yin  Date/Time: 05/17/2024 11:31 AM  Performed by: Lucina Sabal, PA-C Authorized by: Lucina Sabal, PA-C   Consent:    Consent obtained:  Verbal   Consent given by:  Patient   Risks, benefits, and alternatives were discussed: yes     Risks discussed:  Need for additional repair, infection, retained foreign body, poor cosmetic result and poor wound healing   Alternatives discussed:  No treatment Universal protocol:    Procedure explained and questions answered to patient or proxy's satisfaction: yes     Relevant documents present and verified: yes     Patient identity confirmed:  Verbally with patient and arm band Anesthesia:    Anesthesia method:  Local infiltration   Local anesthetic:  Lidocaine  1% w/o epi Laceration details:    Location:  Hand   Hand location:  L hand, dorsum   Length (cm):  3 Pre-procedure details:    Preparation:  Patient was prepped and draped in usual sterile fashion Treatment:    Area cleansed with:  Saline and povidone-iodine   Amount of cleaning:  Extensive   Irrigation solution:  Sterile saline   Irrigation volume:  1000   Irrigation method:  Tap   Debridement:  None   Undermining:  None Skin repair:    Repair method:  Sutures   Suture size:  5-0   Suture material:  Prolene   Suture technique:  Simple interrupted   Number of sutures:  4 Approximation:    Approximation:  Close Repair type:    Repair type:  Simple Post-procedure details:    Dressing:  Open (no dressing)   Procedure completion:  Tolerated well, no immediate complications     Medications Ordered in ED Medications - No data to display  ED Course/ Medical Decision Making/  A&P                                 Medical Decision Making Amount and/or Complexity of Data Reviewed Radiology: ordered.  Risk Prescription drug management.   Medical Decision Making / ED Course   This patient presents to the ED for concern of laceration this involves an extensive number of treatment options,  and is a complaint that carries with it a high risk of complications and morbidity.  The differential diagnosis includes laceration, fracture, retained foreign body  MDM: 63 year old female presents today for concern of laceration to dorsal aspect of left thumb.  This occurred about 34 hours ago.  We discussed risk of infection of closing a wound that is this old.  She understands and would like to proceed. X-ray obtained no retained foreign body.  No underlying fracture. Tetanus shot is up-to-date. Discharged in stable condition.  Return precaution discussed. Last tetanus 10/09/2028 per chart review. See procedure note. Discharged in stable condition.  Discussed applying topical antibiotic.  Keflex prescribed. Patient voices understanding and is in agreement with plan.   Additional history obtained: -Additional history obtained from urgent care note -External records from outside source obtained and reviewed including: Chart review including previous notes, labs, imaging, consultation notes   Lab Tests: -I ordered, reviewed, and interpreted labs.   The pertinent results include:   Labs Reviewed - No data to display    EKG  EKG Interpretation Date/Time:    Ventricular Rate:    PR Interval:    QRS Duration:    QT Interval:    QTC Calculation:   R Axis:      Text Interpretation:           Imaging Studies ordered: I ordered imaging studies including left hand x-ray I independently visualized and interpreted imaging. I agree with the radiologist interpretation   Medicines ordered and prescription drug management: Meds ordered this encounter   Medications   lidocaine  (PF) (XYLOCAINE ) 1 % injection 5 mL    -I have reviewed the patients home medicines and have made adjustments as needed  Reevaluation: After the interventions noted above, I reevaluated the patient and found that they have :improved  Co morbidities that complicate the patient evaluation  Past Medical History:  Diagnosis Date   Arthritis    Depression    Headache    migraines   Heart murmur    History of blood transfusion    Lyme disease    Panic attacks    with driving   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  Return precaution discussed.  Patient voices understanding and is in agreement with the plan.    Final Clinical Impression(s) / ED Diagnoses Final diagnoses:  Laceration of left thumb without foreign body without damage to nail, initial encounter    Rx / DC Orders ED Discharge Orders          Ordered    cephALEXin (KEFLEX) 500 MG capsule  4 times daily        05/17/24 1137              Lucina Sabal, PA-C 05/17/24 1137    [address], PA-C 05/17/24 1210    Lind Repine, MD 05/17/24 1529
